# Patient Record
Sex: Male | Born: 1985
Health system: Southern US, Community
[De-identification: ages and names within clinical notes are randomized; demographics above are authoritative.]

## PROBLEM LIST (undated history)

## (undated) DIAGNOSIS — I10 Essential (primary) hypertension: Secondary | ICD-10-CM

## (undated) DIAGNOSIS — F32A Depression, unspecified: Secondary | ICD-10-CM

## (undated) DIAGNOSIS — F419 Anxiety disorder, unspecified: Secondary | ICD-10-CM

## (undated) DIAGNOSIS — K219 Gastro-esophageal reflux disease without esophagitis: Secondary | ICD-10-CM

## (undated) DIAGNOSIS — E785 Hyperlipidemia, unspecified: Secondary | ICD-10-CM

## (undated) DIAGNOSIS — R51 Headache: Secondary | ICD-10-CM

## (undated) DIAGNOSIS — R519 Headache, unspecified: Secondary | ICD-10-CM

## (undated) DIAGNOSIS — G8929 Other chronic pain: Secondary | ICD-10-CM

## (undated) DIAGNOSIS — T7840XA Allergy, unspecified, initial encounter: Secondary | ICD-10-CM

## (undated) HISTORY — DX: Headache, unspecified: R51.9

## (undated) HISTORY — DX: Essential (primary) hypertension: I10

## (undated) HISTORY — DX: Hyperlipidemia, unspecified: E78.5

## (undated) HISTORY — DX: Allergy, unspecified, initial encounter: T78.40XA

## (undated) HISTORY — DX: Depression, unspecified: F32.A

## (undated) HISTORY — DX: Anxiety disorder, unspecified: F41.9

## (undated) HISTORY — PX: APPENDECTOMY: SHX54

## (undated) HISTORY — DX: Other chronic pain: G89.29

## (undated) HISTORY — DX: Gastro-esophageal reflux disease without esophagitis: K21.9

## (undated) HISTORY — PX: NECK SURGERY: SHX720

## (undated) HISTORY — DX: Headache: R51

---

## 2015-03-13 ENCOUNTER — Emergency Department (HOSPITAL_BASED_OUTPATIENT_CLINIC_OR_DEPARTMENT_OTHER)
Admission: EM | Admit: 2015-03-13 | Discharge: 2015-03-13 | Disposition: A | Discharge status: Home / Self Care | Payer: BLUE CROSS/BLUE SHIELD | Attending: Emergency Medicine | Admitting: Emergency Medicine

## 2015-03-13 ENCOUNTER — Encounter (HOSPITAL_BASED_OUTPATIENT_CLINIC_OR_DEPARTMENT_OTHER): Payer: Self-pay | Admitting: *Deleted

## 2015-03-13 DIAGNOSIS — J40 Bronchitis, not specified as acute or chronic: Secondary | ICD-10-CM | POA: Diagnosis not present

## 2015-03-13 DIAGNOSIS — J040 Acute laryngitis: Secondary | ICD-10-CM

## 2015-03-13 DIAGNOSIS — R52 Pain, unspecified: Secondary | ICD-10-CM | POA: Insufficient documentation

## 2015-03-13 DIAGNOSIS — R05 Cough: Secondary | ICD-10-CM | POA: Diagnosis present

## 2015-03-13 MED ORDER — PREDNISONE 20 MG PO TABS: 40.0000 mg | ORAL_TABLET | Freq: Every day | ORAL | Status: DC

## 2015-03-13 MED ORDER — BENZONATATE 100 MG PO CAPS: 100.0000 mg | ORAL_CAPSULE | Freq: Three times a day (TID) | ORAL | Status: DC

## 2015-03-13 MED ORDER — IBUPROFEN 800 MG PO TABS: 800.0000 mg | ORAL_TABLET | Freq: Three times a day (TID) | ORAL | Status: DC

## 2015-03-13 NOTE — Discharge Instructions (Signed)
Laryngitis °Laryngitis is inflammation of your vocal cords. This causes hoarseness, coughing, loss of voice, sore throat, or a dry throat. Your vocal cords are two bands of muscles that are found in your throat. When you speak, these cords come together and vibrate. These vibrations come out through your mouth as sound. When your vocal cords are inflamed, your voice sounds different. °Laryngitis can be temporary (acute) or long-term (chronic). Most cases of acute laryngitis improve with time. Chronic laryngitis is laryngitis that lasts for more than three weeks. °CAUSES °Acute laryngitis may be caused by: °· A viral infection. °· Lots of talking, yelling, or singing. This is also called vocal strain. °· Bacterial infections. °Chronic laryngitis may be caused by: °· Vocal strain. °· Injury to your vocal cords. °· Acid reflux (gastroesophageal reflux disease or GERD). °· Allergies. °· Sinus infection. °· Smoking. °· Alcohol abuse. °· Breathing in chemicals or dust. °· Growths on the vocal cords. °RISK FACTORS °Risk factors for laryngitis include: °· Smoking. °· Alcohol abuse. °· Having allergies. °SIGNS AND SYMPTOMS °Symptoms of laryngitis may include: °· Low, hoarse voice. °· Loss of voice. °· Dry cough. °· Sore throat. °· Stuffy nose. °DIAGNOSIS °Laryngitis may be diagnosed by: °· Physical exam. °· Throat culture. °· Blood test. °· Laryngoscopy. This procedure allows your health care provider to look at your vocal cords with a mirror or viewing tube. °TREATMENT °Treatment for laryngitis depends on what is causing it. Usually, treatment involves resting your voice and using medicines to soothe your throat. However, if your laryngitis is caused by a bacterial infection, you may need to take antibiotic medicine. If your laryngitis is caused by a growth, you may need to have a procedure to remove it. °HOME CARE INSTRUCTIONS °· Drink enough fluid to keep your urine clear or pale yellow. °· Breathe in moist air. Use a  humidifier if you live in a dry climate. °· Take medicines only as directed by your health care provider. °· If you were prescribed an antibiotic medicine, finish it all even if you start to feel better. °· Do not smoke cigarettes or electronic cigarettes. If you need help quitting, ask your health care provider. °· Talk as little as possible. Also avoid whispering, which can cause vocal strain. °· Write instead of talking. Do this until your voice is back to normal. °SEEK MEDICAL CARE IF: °· You have a fever. °· You have increasing pain. °· You have difficulty swallowing. °SEEK IMMEDIATE MEDICAL CARE IF: °· You cough up blood. °· You have trouble breathing. °  °This information is not intended to replace advice given to you by your health care provider. Make sure you discuss any questions you have with your health care provider. °  °Document Released: 04/30/2005 Document Revised: 05/21/2014 Document Reviewed: 10/13/2013 °Elsevier Interactive Patient Education ©2016 [employer] °Acute Bronchitis °Bronchitis is inflammation of the airways that extend from the windpipe into the lungs (bronchi). The inflammation often causes mucus to develop. This leads to a cough, which is the most common symptom of bronchitis.  °In acute bronchitis, the condition usually develops suddenly and goes away over time, usually in a couple weeks. Smoking, allergies, and asthma can make bronchitis worse. Repeated episodes of bronchitis may cause further lung problems.  °CAUSES °Acute bronchitis is most often caused by the same virus that causes a cold. The virus can spread from person to person (contagious) through coughing, sneezing, and touching contaminated objects. °SIGNS AND SYMPTOMS  °· Cough.   °· Fever.   °·   Coughing up mucus.   °· Body aches.   °· Chest congestion.   °· Chills.   °· Shortness of breath.   °· Sore throat.   °DIAGNOSIS  °Acute bronchitis is usually diagnosed through a physical exam. Your health care provider will  also ask you questions about your medical history. Tests, such as chest X-rays, are sometimes done to rule out other conditions.  °TREATMENT  °Acute bronchitis usually goes away in a couple weeks. Oftentimes, no medical treatment is necessary. Medicines are sometimes given for relief of fever or cough. Antibiotic medicines are usually not needed but may be prescribed in certain situations. In some cases, an inhaler may be recommended to help reduce shortness of breath and control the cough. A cool mist vaporizer may also be used to help thin bronchial secretions and make it easier to clear the chest.  °HOME CARE INSTRUCTIONS °· Get plenty of rest.   °· Drink enough fluids to keep your urine clear or pale yellow (unless you have a medical condition that requires fluid restriction). Increasing fluids may help thin your respiratory secretions (sputum) and reduce chest congestion, and it will prevent dehydration.   °· Take medicines only as directed by your health care provider. °· If you were prescribed an antibiotic medicine, finish it all even if you start to feel better. °· Avoid smoking and secondhand smoke. Exposure to cigarette smoke or irritating chemicals will make bronchitis worse. If you are a smoker, consider using nicotine gum or skin patches to help control withdrawal symptoms. Quitting smoking will help your lungs heal faster.   °· Reduce the chances of another bout of acute bronchitis by washing your hands frequently, avoiding people with cold symptoms, and trying not to touch your hands to your mouth, nose, or eyes.   °· Keep all follow-up visits as directed by your health care provider.   °SEEK MEDICAL CARE IF: °Your symptoms do not improve after 1 week of treatment.  °SEEK IMMEDIATE MEDICAL CARE IF: °· You develop an increased fever or chills.   °· You have chest pain.   °· You have severe shortness of breath. °· You have bloody sputum.   °· You develop dehydration. °· You faint or repeatedly feel  like you are going to pass out. °· You develop repeated vomiting. °· You develop a severe headache. °MAKE SURE YOU:  °· Understand these instructions. °· Will watch your condition. °· Will get help right away if you are not doing well or get worse. °  °This information is not intended to replace advice given to you by your health care provider. Make sure you discuss any questions you have with your health care provider. °  °Document Released: 06/07/2004 Document Revised: 05/21/2014 Document Reviewed: 10/21/2012 °Elsevier Interactive Patient Education ©2016 [employer]. ° °

## 2015-03-13 NOTE — ED Notes (Signed)
Cough, laryngitis, fatigue, body aches x 2 days- no known fever

## 2015-03-13 NOTE — ED Provider Notes (Signed)
CSN: [id number]     Arrival date & time 03/13/15  1024 History   First MD Initiated Contact with Patient 03/13/15 1103     Chief Complaint  Patient presents with  . Cough     (Consider location/radiation/quality/duration/timing/severity/associated sxs/prior Treatment) HPI Comments: Patient presents to the ER for evaluation of cough, voice loss. Symptoms began 2 days ago. He reports that he first lost his voice, now has had a cough productive of clear and yellow sputum. He has had generalized aches but has not documented any fevers. There is no vomiting or diarrhea. Patient does not feel short of breath. He does not have a history of chronic lung disease.  Patient is a 29 y.o. male presenting with cough.  Cough   History reviewed. No pertinent past medical history. Past Surgical History  Procedure Laterality Date  . Appendectomy     No family history on file. Social History  Substance Use Topics  . Smoking status: Never Smoker   . Smokeless tobacco: Never Used  . Alcohol Use: No    Review of Systems  HENT: Positive for voice change.   Respiratory: Positive for cough.   All other systems reviewed and are negative.     Allergies  Review of patient's allergies indicates no known allergies.  Home Medications   Prior to Admission medications   Not on File   BP 124/57 mmHg  Pulse 72  Temp(Src) 98.5 F (36.9 C) (Oral)  Resp 17  Ht 5\' 10"  (1.778 m)  Wt 225 lb (102.059 kg)  BMI 32.28 kg/m2  SpO2 98% Physical Exam  Constitutional: He is oriented to person, place, and time. He appears well-developed and well-nourished. No distress.  HENT:  Head: Normocephalic and atraumatic.  Right Ear: Hearing normal.  Left Ear: Hearing normal.  Nose: Nose normal.  Mouth/Throat: Oropharynx is clear and moist and mucous membranes are normal.  Eyes: Conjunctivae and EOM are normal. Pupils are equal, round, and reactive to light.  Neck: Normal range of motion. Neck supple.   Cardiovascular: Regular rhythm, S1 normal and S2 normal.  Exam reveals no gallop and no friction rub.   No murmur heard. Pulmonary/Chest: Effort normal and breath sounds normal. No respiratory distress. He exhibits no tenderness.  Abdominal: Soft. Normal appearance and bowel sounds are normal. There is no hepatosplenomegaly. There is no tenderness. There is no rebound, no guarding, no tenderness at McBurney's point and negative Murphy's sign. No hernia.  Musculoskeletal: Normal range of motion.  Neurological: He is alert and oriented to person, place, and time. He has normal strength. No cranial nerve deficit or sensory deficit. Coordination normal. GCS eye subscore is 4. GCS verbal subscore is 5. GCS motor subscore is 6.  Skin: Skin is warm, dry and intact. No rash noted. No cyanosis.  Psychiatric: He has a normal mood and affect. His speech is normal and behavior is normal. Thought content normal.  Nursing note and vitals reviewed.   ED Course  Procedures (including critical care time) Labs Review Labs Reviewed - No data to display  Imaging Review No results found. I have personally reviewed and evaluated these images and lab results as part of my medical decision-making.   EKG Interpretation None      MDM   Final diagnoses:  None   laryngitis Bronchitis  She presents to the ER for evaluation of upper respiratory infection symptoms have been ongoing for 2 days. His lungs are clear. There is no acute bronchospasm and he does not  have any clinical concern for pneumonia. Oxygenation is 90% on room air. Vital signs are entirely normal. He is afebrile. Symptoms are consistent with a viral process causing laryngitis and bronchitis. Will treat symptomatically.    Gilda Crease, MD 03/13/15 1125

## 2015-08-05 ENCOUNTER — Telehealth: Payer: Self-pay | Admitting: Behavioral Health

## 2015-08-05 NOTE — Telephone Encounter (Signed)
Unable to reach patient at time of Pre-Visit Call.  Left message for patient to return call when available.    

## 2015-08-08 ENCOUNTER — Ambulatory Visit (INDEPENDENT_AMBULATORY_CARE_PROVIDER_SITE_OTHER): Payer: BLUE CROSS/BLUE SHIELD | Admitting: Medical

## 2015-08-08 ENCOUNTER — Encounter: Payer: Self-pay | Admitting: Medical

## 2015-08-08 VITALS — BP 118/78 | HR 90 | Temp 98.2°F | Resp 16 | Ht 70.0 in | Wt 234.0 lb

## 2015-08-08 DIAGNOSIS — M545 Low back pain: Secondary | ICD-10-CM | POA: Diagnosis not present

## 2015-08-08 DIAGNOSIS — J309 Allergic rhinitis, unspecified: Secondary | ICD-10-CM

## 2015-08-08 MED ORDER — LEVOCETIRIZINE DIHYDROCHLORIDE 5 MG PO TABS: 5.0000 mg | ORAL_TABLET | Freq: Every evening | ORAL | Status: DC

## 2015-08-08 MED ORDER — DICLOFENAC SODIUM 75 MG PO TBEC: 75.0000 mg | DELAYED_RELEASE_TABLET | Freq: Two times a day (BID) | ORAL | Status: DC

## 2015-08-08 MED ORDER — FLUTICASONE PROPIONATE 50 MCG/ACT NA SUSP: 2.0000 | Freq: Every day | NASAL | Status: DC

## 2015-08-08 MED FILL — FLUTICASONE PROP 50 MCG SPR: 50 | 30 days supply | Qty: 16 | Fill #0

## 2015-08-08 MED FILL — DICLOFENAC SOD EC 75 MG TAB: 75 | 15 days supply | Qty: 30 | Fill #0

## 2015-08-08 NOTE — Progress Notes (Signed)
Subjective:    Patient ID: Ian Romero, male    DOB: 1985-07-19, 30 y.o.   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 Married- 1 child.  Pt states nasal congestion year round allergies. He takes allegra D and mucinex. Pt used to use nasal spray in the past and it seemed to help.But then he stopped. Worse season is spring. Currently some sneezing and pnd. But he expects will flare soon.  Pt also had some low back pain last Monday. Got up from seated position. He felt spasms on past Monday and Tuesday. Hurt to walk. Pain is almost gone now. But faint occasional mild pain depending on positon and certain movements. No pain shooting to legs at any point. No leg weakness. No numbness.  Pt states very rare lower back pain. But usually never has.      Review of Systems  Constitutional: Negative for fever, chills and fatigue.  HENT: Positive for congestion, postnasal drip, rhinorrhea and sneezing.   Respiratory: Negative for chest tightness, shortness of breath and wheezing.   Cardiovascular: Negative for chest pain and palpitations.  Gastrointestinal: Negative for abdominal pain.  Musculoskeletal: Positive for back pain. Negative for gait problem.  Neurological: Negative for dizziness, numbness and headaches.  Hematological: Negative for adenopathy. Does not bruise/bleed easily.  Psychiatric/Behavioral: Negative for confusion and agitation.    Past Medical History  Diagnosis Date  . Allergy     Social History   Social History  . Marital Status: Married    Spouse Name: N/A  . Number of Children: N/A  . Years of Education: N/A   Occupational History  . Not on file.   Social History Main Topics  . Smoking status: Never Smoker   . Smokeless tobacco: Never Used  . Alcohol Use: No  . Drug Use: No  . Sexual  Activity: Yes   Other Topics Concern  . Not on file   Social History Narrative    Past Surgical History  Procedure Laterality Date  . Appendectomy      History reviewed. No pertinent family history.  No Known Allergies  No current outpatient prescriptions on file prior to visit.   No current facility-administered medications on file prior to visit.    BP 118/78 mmHg  Pulse 90  Temp(Src) 98.2 F (36.8 C) (Oral)  Resp 16  Ht  (1.778 m)  Wt 234 lb (106.142 kg)  BMI 33.58 kg/m2  SpO2 98%       Objective:   Physical Exam   General  Mental Status - Alert. General Appearance - Well groomed. Not in acute distress.  Skin Rashes- No Rashes.  HEENT Head- Normal. Ear Auditory Canal - Left- Normal. Right - Normal.Tympanic Membrane- Left- Normal. Right- Normal. Eye Sclera/Conjunctiva- Left- Normal. Right- Normal. Nose & Sinuses Nasal Mucosa- Left-  Boggy and Congested. Right-  Boggy and  Congested.Bilateral maxillary and frontal sinus pressure. Mouth & Throat Lips: Upper Lip- Normal: no dryness, cracking, pallor, cyanosis, or vesicular eruption. Lower Lip-Normal: no dryness, cracking, pallor, cyanosis or vesicular eruption. Buccal Mucosa- Bilateral- No Aphthous ulcers. Oropharynx- No Discharge or Erythema. Tonsils: Characteristics- Bilateral- No Erythema or Congestion. Size/Enlargement- Bilateral- No enlargement. Discharge- bilateral-None.  Neck Neck- Supple. No Masses.   Chest and Lung Exam Auscultation: Breath Sounds:-Clear even and unlabored.  Cardiovascular Auscultation:Rythm- Regular, rate and rhythm. Murmurs & Other Heart Sounds:Ausculatation of the heart reveal- No Murmurs.  Lymphatic Head & Neck General Head & Neck Lymphatics: Bilateral: Description- No Localized lymphadenopathy.   Spleen:- Normal.   Back Mid faint to left of  lumbar spine(si area) tenderness to palpation. Pain none  on straight leg lift. Pain none on lateral movements  and flexion/extension of the spine.  Lower ext neurologic  L5-S1 sensation intact bilaterally. Normal patellar reflexes bilaterally. No foot drop bilaterally.      Assessment & Plan:  For allergies will rx xyzal. If this does not work well then go back to [employer].  Rx flonase as well for nasal congestion.  For your back pain rx diclofenac. Conservative measures discussed. If back pain is persisting past another week then get xray lumbar spine.  Follow up in 2 weeks or as needed.  You can also schedule for CPE .

## 2015-08-08 NOTE — Progress Notes (Signed)
Pre visit review using our clinic review tool, if applicable. No additional management support is needed unless otherwise documented below in the visit note. 

## 2015-08-08 NOTE — Patient Instructions (Signed)
For allergies will rx xyzal. If this does not work well then go back to [employer].  Rx flonase as well for nasal congestion.  For your back pain rx diclofenac. Conservative measures discussed. If back pain is persisting past another week then get xray lumbar spine.  Follow up in 2 weeks or as needed.  You can also schedule for CPE .

## 2015-08-23 ENCOUNTER — Telehealth: Payer: Self-pay

## 2015-08-23 NOTE — Telephone Encounter (Signed)
Attempted to update chart with wife. Wife not sure about  All information. Left message for patient to return call.

## 2015-08-23 NOTE — Telephone Encounter (Signed)
Patient returning your call.

## 2015-08-24 ENCOUNTER — Encounter: Payer: Self-pay | Admitting: Medical

## 2015-08-24 ENCOUNTER — Ambulatory Visit (INDEPENDENT_AMBULATORY_CARE_PROVIDER_SITE_OTHER): Payer: BLUE CROSS/BLUE SHIELD | Admitting: Medical

## 2015-08-24 VITALS — BP 120/76 | HR 89 | Temp 98.1°F | Ht 70.0 in | Wt 234.2 lb

## 2015-08-24 DIAGNOSIS — Z0000 Encounter for general adult medical examination without abnormal findings: Secondary | ICD-10-CM | POA: Diagnosis not present

## 2015-08-24 DIAGNOSIS — Z113 Encounter for screening for infections with a predominantly sexual mode of transmission: Secondary | ICD-10-CM

## 2015-08-24 DIAGNOSIS — Z23 Encounter for immunization: Secondary | ICD-10-CM | POA: Diagnosis not present

## 2015-08-24 LAB — LIPID PANEL
CHOL/HDL RATIO: 6
Cholesterol: 170 mg/dL (ref 0–200)
HDL: 28.7 mg/dL — AB (ref >39.00)
LDL Cholesterol: 103 mg/dL — ABNORMAL HIGH (ref 0–99)
NONHDL: 140.87
Triglycerides: 189 mg/dL — ABNORMAL HIGH (ref 0.0–149.0)
VLDL: 37.8 mg/dL (ref 0.0–40.0)

## 2015-08-24 LAB — POC URINALSYSI DIPSTICK (AUTOMATED)
Bilirubin, UA: NEGATIVE
GLUCOSE UA: NEGATIVE
Leukocytes, UA: NEGATIVE
NITRITE UA: NEGATIVE
RBC UA: NEGATIVE
Urobilinogen, UA: 0.2
pH, UA: 5.5

## 2015-08-24 LAB — CBC WITH DIFFERENTIAL/PLATELET
BASOS PCT: 0.6 % (ref 0.0–3.0)
Basophils Absolute: 0 10*3/uL (ref 0.0–0.1)
EOS PCT: 3.7 % (ref 0.0–5.0)
Eosinophils Absolute: 0.1 10*3/uL (ref 0.0–0.7)
HCT: 44.5 % (ref 39.0–52.0)
Hemoglobin: 14.8 g/dL (ref 13.0–17.0)
LYMPHS ABS: 2 10*3/uL (ref 0.7–4.0)
Lymphocytes Relative: 51.8 % — ABNORMAL HIGH (ref 12.0–46.0)
MCHC: 33.2 g/dL (ref 30.0–36.0)
MCV: 84.4 fl (ref 78.0–100.0)
MONO ABS: 0.3 10*3/uL (ref 0.1–1.0)
Monocytes Relative: 6.6 % (ref 3.0–12.0)
NEUTROS PCT: 37.3 % — AB (ref 43.0–77.0)
Neutro Abs: 1.5 10*3/uL (ref 1.4–7.7)
PLATELETS: 229 10*3/uL (ref 150.0–400.0)
RBC: 5.28 Mil/uL (ref 4.22–5.81)
RDW: 14.6 % (ref 11.5–15.5)
WBC: 3.9 10*3/uL — ABNORMAL LOW (ref 4.0–10.5)

## 2015-08-24 LAB — COMPREHENSIVE METABOLIC PANEL
ALT: 53 U/L (ref 0–53)
AST: 27 U/L (ref 0–37)
Albumin: 4.7 g/dL (ref 3.5–5.2)
Alkaline Phosphatase: 66 U/L (ref 39–117)
BUN: 15 mg/dL (ref 6–23)
CHLORIDE: 104 meq/L (ref 96–112)
CO2: 32 mEq/L (ref 19–32)
Calcium: 9.9 mg/dL (ref 8.4–10.5)
Creatinine, Ser: 1 mg/dL (ref 0.40–1.50)
GFR: 112 mL/min (ref >60.00)
GLUCOSE: 90 mg/dL (ref 70–99)
POTASSIUM: 4.3 meq/L (ref 3.5–5.1)
SODIUM: 140 meq/L (ref 135–145)
Total Bilirubin: 0.5 mg/dL (ref 0.2–1.2)
Total Protein: 7.5 g/dL (ref 6.0–8.3)

## 2015-08-24 LAB — HIV ANTIBODY (ROUTINE TESTING W REFLEX): HIV 1&2 Ab, 4th Generation: NONREACTIVE

## 2015-08-24 LAB — TSH: TSH: 1.1 u[IU]/mL (ref 0.35–4.50)

## 2015-08-24 MED ORDER — SUMATRIPTAN SUCCINATE 50 MG PO TABS: 50.0000 mg | ORAL_TABLET | ORAL | Status: DC | PRN

## 2015-08-24 NOTE — Assessment & Plan Note (Signed)
Cbc, cmp, tsh, lipid, hiv screen, ua and tdap today.

## 2015-08-24 NOTE — Progress Notes (Signed)
Pre visit review using our clinic review tool, if applicable. No additional management support is needed unless otherwise documented below in the visit note. 

## 2015-08-24 NOTE — Addendum Note (Signed)
Addended by: Neldon LabellaMABE, HOLDEN S on: 08/24/2015 02:14 PM   Modules accepted: Orders

## 2015-08-24 NOTE — Telephone Encounter (Signed)
Appointment scheduled for today 

## 2015-08-24 NOTE — Progress Notes (Signed)
Subjective:    Patient ID: Ian Romero, male    DOB: 02/25/1986, 30 y.o.   MRN: [id number]  HPI  Pt in for a physical.  Pt fasting.  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 Pt does exercise 3 times a week, Drinks soda 4 times a week, Avoids eating out. Married- 1 child.  Pt needs tetanus vaccine. Will get hiv screen today.    Review of Systems  Constitutional: Negative for fever, chills, diaphoresis, activity change and fatigue.  Respiratory: Negative for cough, chest tightness and shortness of breath.   Cardiovascular: Negative for chest pain, palpitations and leg swelling.  Gastrointestinal: Negative for nausea, vomiting and abdominal pain.  Musculoskeletal: Negative for neck pain and neck stiffness.  Neurological: Positive for headaches. Negative for dizziness, weakness and numbness.       Minimal faint ha present  weekend. . He states faint light senstiivity. But no gross motor or sensory deficits.  Mildd sound sensitiive.  Saturday and Sunday had HA. Then went away. Hx of migraine. In [place] was given medication in the past.  Psychiatric/Behavioral: Negative for behavioral problems, confusion and agitation. The patient is not nervous/anxious.    Past Medical History  Diagnosis Date  . Allergy     Social History   Social History  . Marital Status: Married    Spouse Name: N/A  . Number of Children: N/A  . Years of Education: N/A   Occupational History  . Not on file.   Social History Main Topics  . Smoking status: Never Smoker   . Smokeless tobacco: Never Used  . Alcohol Use: No  . Drug Use: No  . Sexual Activity: Yes   Other Topics Concern  . Not on file   Social History Narrative    Past Surgical History  Procedure Laterality Date  . Appendectomy      No family history on file.  No Known Allergies  Current Outpatient Prescriptions on File Prior to Visit  Medication  Sig Dispense Refill  . diclofenac (VOLTAREN) 75 MG EC tablet Take 1 tablet (75 mg total) by mouth 2 (two) times daily. 30 tablet 0  . fluticasone (FLONASE) 50 MCG/ACT nasal spray Place 2 sprays into both nostrils daily. 16 g 11  . levocetirizine (XYZAL) 5 MG tablet Take 1 tablet (5 mg total) by mouth every evening. 90 tablet 3   No current facility-administered medications on file prior to visit.    BP 120/76 mmHg  Pulse 89  Temp(Src) 98.1 F (36.7 C) (Oral)  Ht 5\' 10"  (1.778 m)  Wt 234 lb 3.2 oz (106.232 kg)  BMI 33.60 kg/m2  SpO2 98%      Objective:   Physical Exam  General Mental Status- Alert. General Appearance- Not in acute distress.   Skin General: Color- Normal Color. Moisture- Normal Moisture.  Neck Carotid Arteries- Normal color. Moisture- Normal Moisture. No carotid bruits. No JVD.  Chest and Lung Exam Auscultation: Breath Sounds:-Normal.  Cardiovascular Auscultation:Rythm- Regular. Murmurs & Other Heart Sounds:Auscultation of the heart reveals- No Murmurs.  Abdomen Inspection:-Inspeection Normal. Palpation/Percussion:Note:No mass. Palpation and Percussion of the abdomen reveal- Non Tender, Non Distended + BS, no rebound or guarding.  Neurologic Cranial Nerve exam:- CN III-XII intact(No nystagmus), symmetric smile. Strength:- 5/5 equal and symmetric strength both upper and lower extremities.   Male Genitourinary Pt deferred.          Assessment & Plan:

## 2015-08-24 NOTE — Patient Instructions (Addendum)
Wellness examination Cbc, cmp, tsh, lipid, hiv screen, ua and tdap today.   Continue healthy diet and exercise. BMI 33 presently. Best if could get below 30. Weight loss 10-15 pounds would likely put below 30.  Your describe in past maybe migraine type ha. Will rx imitrex to use. If you do use I want you to call us and give [place] report if stopped ha. If does not stop ha/severe ha then ED evaluation. To be determined after lab review. Preventive Care for Adults, Male A healthy lifestyle and preventive care can promote health and wellness. Preventive health guidelines for men include the following key practices:  A routine yearly physical is a good way to check with your health care provider about your health and preventative screening. It is a chance to share any concerns and updates on your health and to receive a thorough exam.  Visit your dentist for a routine exam and preventative care every 6 months. Brush your teeth twice a day and floss once a day. Good oral hygiene prevents tooth decay and gum disease.  The frequency of eye exams is based on your age, health, family medical history, use of contact lenses, and other factors. Follow your health care provider's recommendations for frequency of eye exams.  Eat a healthy diet. Foods such as vegetables, fruits, whole grains, low-fat dairy products, and lean protein foods contain the nutrients you need without too many calories. Decrease your intake of foods high in solid fats, added sugars, and salt. Eat the right amount of calories for you.Get information about a proper diet from your health care provider, if necessary.  Regular physical exercise is one of the most important things you can do for your health. Most adults should get at least 150 minutes of moderate-intensity exercise (any activity that increases your heart rate and causes you to sweat) each week. In addition, most adults need muscle-strengthening exercises on 2 or more days a  week.  Maintain a healthy weight. The body mass index (BMI) is a screening tool to identify possible weight problems. It provides an estimate of body fat based on height and weight. Your health care provider can find your BMI and can help you achieve or maintain a healthy weight.For adults 20 years and older:  A BMI below 18.5 is considered underweight.  A BMI of 18.5 to 24.9 is normal.  A BMI of 25 to 29.9 is considered overweight.  A BMI of 30 and above is considered obese.  Maintain normal blood lipids and cholesterol levels by exercising and minimizing your intake of saturated fat. Eat a balanced diet with plenty of fruit and vegetables. Blood tests for lipids and cholesterol should begin at age 45 and be repeated every 5 years. If your lipid or cholesterol levels are high, you are over 50, or you are at high risk for heart disease, you may need your cholesterol levels checked more frequently.Ongoing high lipid and cholesterol levels should be treated with medicines if diet and exercise are not working.  If you smoke, find out from your health care provider how to quit. If you do not use tobacco, do not start.  Lung cancer screening is recommended for adults aged 24-80 years who are at high risk for developing lung cancer because of a history of smoking. A yearly low-dose CT scan of the lungs is recommended for people who have at least a 30-pack-year history of smoking and are a current smoker or have quit within the past 15 years.  A pack year of smoking is smoking an average of 1 pack of cigarettes a day for 1 year (for example: 1 pack a day for 30 years or 2 packs a day for 15 years). Yearly screening should continue until the smoker has stopped smoking for at least 15 years. Yearly screening should be stopped for people who develop a health problem that would prevent them from having lung cancer treatment.  If you choose to drink alcohol, do not have more than 2 drinks per day. One drink  is considered to be 12 ounces (355 mL) of beer, 5 ounces (148 mL) of wine, or 1.5 ounces (44 mL) of liquor.  Avoid use of street drugs. Do not share needles with anyone. Ask for help if you need support or instructions about stopping the use of drugs.  High blood pressure causes heart disease and increases the risk of stroke. Your blood pressure should be checked at least every 1-2 years. Ongoing high blood pressure should be treated with medicines, if weight loss and exercise are not effective.  If you are 81-47 years old, ask your health care provider if you should take aspirin to prevent heart disease.  Diabetes screening is done by taking a blood sample to check your blood glucose level after you have not eaten for a certain period of time (fasting). If you are not overweight and you do not have risk factors for diabetes, you should be screened once every 3 years starting at age 56. If you are overweight or obese and you are 72-74 years of age, you should be screened for diabetes every year as part of your cardiovascular risk assessment.  Colorectal cancer can be detected and often prevented. Most routine colorectal cancer screening begins at the age of 77 and continues through age 41. However, your health care provider may recommend screening at an earlier age if you have risk factors for colon cancer. On a yearly basis, your health care provider may provide home test kits to check for hidden blood in the stool. Use of a small camera at the end of a tube to directly examine the colon (sigmoidoscopy or colonoscopy) can detect the earliest forms of colorectal cancer. Talk to your health care provider about this at age 55, when routine screening begins. Direct exam of the colon should be repeated every 5-10 years through age 17, unless early forms of precancerous polyps or small growths are found.  People who are at an increased risk for hepatitis B should be screened for this virus. You are considered  at high risk for hepatitis B if:  You were born in a country where hepatitis B occurs often. Talk with your health care provider about which countries are considered high risk.  Your parents were born in a high-risk country and you have not received a shot to protect against hepatitis B (hepatitis B vaccine).  You have HIV or AIDS.  You use needles to inject street drugs.  You live with, or have sex with, someone who has hepatitis B.  You are a man who has sex with other men (MSM).  You get hemodialysis treatment.  You take certain medicines for conditions such as cancer, organ transplantation, and autoimmune conditions.  Hepatitis C blood testing is recommended for all people born from [birth year] through 1965 and any individual with known risks for hepatitis C.  Practice safe sex. Use condoms and avoid high-risk sexual practices to reduce the spread of sexually transmitted infections (STIs). STIs include  gonorrhea, chlamydia, syphilis, trichomonas, herpes, HPV, and human immunodeficiency virus (HIV). Herpes, HIV, and HPV are viral illnesses that have no cure. They can result in disability, cancer, and death.  If you are a man who has sex with other men, you should be screened at least once per year for:  HIV.  Urethral, rectal, and pharyngeal infection of gonorrhea, chlamydia, or both.  If you are at risk of being infected with HIV, it is recommended that you take a prescription medicine daily to prevent HIV infection. This is called preexposure prophylaxis (PrEP). You are considered at risk if:  You are a man who has sex with other men (MSM) and have other risk factors.  You are a heterosexual man, are sexually active, and are at increased risk for HIV infection.  You take drugs by injection.  You are sexually active with a partner who has HIV.  Talk with your health care provider about whether you are at high risk of being infected with HIV. If you choose to begin PrEP, you should  first be tested for HIV. You should then be tested every 3 months for as long as you are taking PrEP.  A one-time screening for abdominal aortic aneurysm (AAA) and surgical repair of large AAAs by ultrasound are recommended for men ages 82 to 50 years who are current or former smokers.  Healthy men should no longer receive prostate-specific antigen (PSA) blood tests as part of routine cancer screening. Talk with your health care provider about prostate cancer screening.  Testicular cancer screening is not recommended for adult males who have no symptoms. Screening includes self-exam, a health care provider exam, and other screening tests. Consult with your health care provider about any symptoms you have or any concerns you have about testicular cancer.  Use sunscreen. Apply sunscreen liberally and repeatedly throughout the day. You should seek shade when your shadow is shorter than you. Protect yourself by wearing long sleeves, pants, a wide-brimmed hat, and sunglasses year round, whenever you are outdoors.  Once a month, do a whole-body skin exam, using a mirror to look at the skin on your back. Tell your health care provider about new moles, moles that have irregular borders, moles that are larger than a pencil eraser, or moles that have changed in shape or color.  Stay current with required vaccines (immunizations).  Influenza vaccine. All adults should be immunized every year.  Tetanus, diphtheria, and acellular pertussis (Td, Tdap) vaccine. An adult who has not previously received Tdap or who does not know his vaccine status should receive 1 dose of Tdap. This initial dose should be followed by tetanus and diphtheria toxoids (Td) booster doses every 10 years. Adults with an unknown or incomplete history of completing a 3-dose immunization series with Td-containing vaccines should begin or complete a primary immunization series including a Tdap dose. Adults should receive a Td booster every 10  years.  Varicella vaccine. An adult without evidence of immunity to varicella should receive 2 doses or a second dose if he has previously received 1 dose.  Human papillomavirus (HPV) vaccine. Males aged 11-21 years who have not received the vaccine previously should receive the 3-dose series. Males aged 22-26 years may be immunized. Immunization is recommended through the age of 27 years for any male who has sex with males and did not get any or all doses earlier. Immunization is recommended for any person with an immunocompromised condition through the age of 34 years if he did not  get any or all doses earlier. During the 3-dose series, the second dose should be obtained 4-8 weeks after the first dose. The third dose should be obtained 24 weeks after the first dose and 16 weeks after the second dose.  Zoster vaccine. One dose is recommended for adults aged 53 years or older unless certain conditions are present.  Measles, mumps, and rubella (MMR) vaccine. Adults born before [birth year] generally are considered immune to measles and mumps. Adults born in [birth year] or later should have 1 or more doses of MMR vaccine unless there is a contraindication to the vaccine or there is laboratory evidence of immunity to each of the three diseases. A routine second dose of MMR vaccine should be obtained at least 28 days after the first dose for students attending postsecondary schools, health care workers, or international travelers. People who received inactivated measles vaccine or an unknown type of measles vaccine during 1963-1967 should receive 2 doses of MMR vaccine. People who received inactivated mumps vaccine or an unknown type of mumps vaccine before 1979 and are at high risk for mumps infection should consider immunization with 2 doses of MMR vaccine. Unvaccinated health care workers born before [birth year] who lack laboratory evidence of measles, mumps, or rubella immunity or laboratory confirmation of disease should  consider measles and mumps immunization with 2 doses of MMR vaccine or rubella immunization with 1 dose of MMR vaccine.  Pneumococcal 13-valent conjugate (PCV13) vaccine. When indicated, a person who is uncertain of his immunization history and has no record of immunization should receive the PCV13 vaccine. All adults 25 years of age and older should receive this vaccine. An adult aged 7 years or older who has certain medical conditions and has not been previously immunized should receive 1 dose of PCV13 vaccine. This PCV13 should be followed with a dose of pneumococcal polysaccharide (PPSV23) vaccine. Adults who are at high risk for pneumococcal disease should obtain the PPSV23 vaccine at least 8 weeks after the dose of PCV13 vaccine. Adults older than 31 years of age who have normal immune system function should obtain the PPSV23 vaccine dose at least 1 year after the dose of PCV13 vaccine.  Pneumococcal polysaccharide (PPSV23) vaccine. When PCV13 is also indicated, PCV13 should be obtained first. All adults aged 26 years and older should be immunized. An adult younger than age 70 years who has certain medical conditions should be immunized. Any person who resides in a nursing home or long-term care facility should be immunized. An adult smoker should be immunized. People with an immunocompromised condition and certain other conditions should receive both PCV13 and PPSV23 vaccines. People with human immunodeficiency virus (HIV) infection should be immunized as soon as possible after diagnosis. Immunization during chemotherapy or radiation therapy should be avoided. Routine use of PPSV23 vaccine is not recommended for American Indians, [place] Natives, or people younger than 65 years unless there are medical conditions that require PPSV23 vaccine. When indicated, people who have unknown immunization and have no record of immunization should receive PPSV23 vaccine. One-time revaccination 5 years after the first  dose of PPSV23 is recommended for people aged 19-64 years who have chronic kidney failure, nephrotic syndrome, asplenia, or immunocompromised conditions. People who received 1-2 doses of PPSV23 before age 16 years should receive another dose of PPSV23 vaccine at age 105 years or later if at least 5 years have passed since the previous dose. Doses of PPSV23 are not needed for people immunized with PPSV23 at or after age 12  years.  Meningococcal vaccine. Adults with asplenia or persistent complement component deficiencies should receive 2 doses of quadrivalent meningococcal conjugate (MenACWY-D) vaccine. The doses should be obtained at least 2 months apart. Microbiologists working with certain meningococcal bacteria, [place] recruits, people at risk during an outbreak, and people who travel to or live in countries with a high rate of meningitis should be immunized. A first-year college student up through age 54 years who is living in a residence hall should receive a dose if he did not receive a dose on or after his 16th birthday. Adults who have certain high-risk conditions should receive one or more doses of vaccine.  Hepatitis A vaccine. Adults who wish to be protected from this disease, have chronic liver disease, work with hepatitis A-infected animals, work in hepatitis A research labs, or travel to or work in countries with a high rate of hepatitis A should be immunized. Adults who were previously unvaccinated and who anticipate close contact with an international adoptee during the first 60 days after arrival in the [place] from a country with a high rate of hepatitis A should be immunized.  Hepatitis B vaccine. Adults should be immunized if they wish to be protected from this disease, are under age 41 years and have diabetes, have chronic liver disease, have had more than one sex partner in the past 6 months, may be exposed to blood or other infectious body fluids, are household contacts or sex  partners of hepatitis B positive people, are clients or workers in certain care facilities, or travel to or work in countries with a high rate of hepatitis B.  Haemophilus influenzae type b (Hib) vaccine. A previously unvaccinated person with asplenia or sickle cell disease or having a scheduled splenectomy should receive 1 dose of Hib vaccine. Regardless of previous immunization, a recipient of a hematopoietic stem cell transplant should receive a 3-dose series 6-12 months after his successful transplant. Hib vaccine is not recommended for adults with HIV infection. Preventive Service / Frequency Ages 26 to 26  Blood pressure check.** / Every 3-5 years.  Lipid and cholesterol check.** / Every 5 years beginning at age 66.  Hepatitis C blood test.** / For any individual with known risks for hepatitis C.  Skin self-exam. / Monthly.  Influenza vaccine. / Every year.  Tetanus, diphtheria, and acellular pertussis (Tdap, Td) vaccine.** / Consult your health care provider. 1 dose of Td every 10 years.  Varicella vaccine.** / Consult your health care provider.  HPV vaccine. / 3 doses over 6 months, if 55 or younger.  Measles, mumps, rubella (MMR) vaccine.** / You need at least 1 dose of MMR if you were born in 1957 or later. You may also need a second dose.  Pneumococcal 13-valent conjugate (PCV13) vaccine.** / Consult your health care provider.  Pneumococcal polysaccharide (PPSV23) vaccine.** / 1 to 2 doses if you smoke cigarettes or if you have certain conditions.  Meningococcal vaccine.** / 1 dose if you are age 44 to 31 years and a [employer] researcher living in a residence hall, or have one of several medical conditions. You may also need additional booster doses.  Hepatitis A vaccine.** / Consult your health care provider.  Hepatitis B vaccine.** / Consult your health care provider.  Haemophilus influenzae type b (Hib) vaccine.** / Consult your health care provider. Ages 67 to  76  Blood pressure check.** / Every year.  Lipid and cholesterol check.** / Every 5 years beginning at age 65.  Lung  cancer screening. / Every year if you are aged 60-80 years and have a 30-pack-year history of smoking and currently smoke or have quit within the past 15 years. Yearly screening is stopped once you have quit smoking for at least 15 years or develop a health problem that would prevent you from having lung cancer treatment.  Fecal occult blood test (FOBT) of stool. / Every year beginning at age 22 and continuing until age 21. You may not have to do this test if you get a colonoscopy every 10 years.  Flexible sigmoidoscopy** or colonoscopy.** / Every 5 years for a flexible sigmoidoscopy or every 10 years for a colonoscopy beginning at age 3 and continuing until age 38.  Hepatitis C blood test.** / For all people born from [birth year] through 1965 and any individual with known risks for hepatitis C.  Skin self-exam. / Monthly.  Influenza vaccine. / Every year.  Tetanus, diphtheria, and acellular pertussis (Tdap/Td) vaccine.** / Consult your health care provider. 1 dose of Td every 10 years.  Varicella vaccine.** / Consult your health care provider.  Zoster vaccine.** / 1 dose for adults aged 57 years or older.  Measles, mumps, rubella (MMR) vaccine.** / You need at least 1 dose of MMR if you were born in 1957 or later. You may also need a second dose.  Pneumococcal 13-valent conjugate (PCV13) vaccine.** / Consult your health care provider.  Pneumococcal polysaccharide (PPSV23) vaccine.** / 1 to 2 doses if you smoke cigarettes or if you have certain conditions.  Meningococcal vaccine.** / Consult your health care provider.  Hepatitis A vaccine.** / Consult your health care provider.  Hepatitis B vaccine.** / Consult your health care provider.  Haemophilus influenzae type b (Hib) vaccine.** / Consult your health care provider. Ages 25 and over  Blood pressure check.** /  Every year.  Lipid and cholesterol check.**/ Every 5 years beginning at age 55.  Lung cancer screening. / Every year if you are aged 6-80 years and have a 30-pack-year history of smoking and currently smoke or have quit within the past 15 years. Yearly screening is stopped once you have quit smoking for at least 15 years or develop a health problem that would prevent you from having lung cancer treatment.  Fecal occult blood test (FOBT) of stool. / Every year beginning at age 70 and continuing until age 83. You may not have to do this test if you get a colonoscopy every 10 years.  Flexible sigmoidoscopy** or colonoscopy.** / Every 5 years for a flexible sigmoidoscopy or every 10 years for a colonoscopy beginning at age 40 and continuing until age 33.  Hepatitis C blood test.** / For all people born from [birth year] through 1966 and any individual with known risks for hepatitis C.  Abdominal aortic aneurysm (AAA) screening.** / A one-time screening for ages 20 to 34 years who are current or former smokers.  Skin self-exam. / Monthly.  Influenza vaccine. / Every year.  Tetanus, diphtheria, and acellular pertussis (Tdap/Td) vaccine.** / 1 dose of Td every 10 years.  Varicella vaccine.** / Consult your health care provider.  Zoster vaccine.** / 1 dose for adults aged 37 years or older.  Pneumococcal 13-valent conjugate (PCV13) vaccine.** / 1 dose for all adults aged 54 years and older.  Pneumococcal polysaccharide (PPSV23) vaccine.** / 1 dose for all adults aged 66 years and older.  Meningococcal vaccine.** / Consult your health care provider.  Hepatitis A vaccine.** / Consult your health care provider.  Hepatitis B  vaccine.** / Consult your health care provider.  Haemophilus influenzae type b (Hib) vaccine.** / Consult your health care provider. **Family history and personal history of risk and conditions may change your health care provider's recommendations.   This information is not  intended to replace advice given to you by your health care provider. Make sure you discuss any questions you have with your health care provider.   Document Released: 06/26/2001 Document Revised: 05/21/2014 Document Reviewed: 09/25/2010 Elsevier Interactive Patient Education [employer].

## 2015-08-27 ENCOUNTER — Encounter (HOSPITAL_BASED_OUTPATIENT_CLINIC_OR_DEPARTMENT_OTHER): Payer: Self-pay | Admitting: *Deleted

## 2015-08-27 ENCOUNTER — Emergency Department (HOSPITAL_BASED_OUTPATIENT_CLINIC_OR_DEPARTMENT_OTHER)
Admission: EM | Admit: 2015-08-27 | Discharge: 2015-08-27 | Disposition: A | Discharge status: Home / Self Care | Payer: BLUE CROSS/BLUE SHIELD | Attending: Emergency Medicine | Admitting: Emergency Medicine

## 2015-08-27 ENCOUNTER — Emergency Department (HOSPITAL_BASED_OUTPATIENT_CLINIC_OR_DEPARTMENT_OTHER): Payer: BLUE CROSS/BLUE SHIELD

## 2015-08-27 DIAGNOSIS — R509 Fever, unspecified: Secondary | ICD-10-CM | POA: Diagnosis present

## 2015-08-27 DIAGNOSIS — J301 Allergic rhinitis due to pollen: Secondary | ICD-10-CM | POA: Diagnosis not present

## 2015-08-27 DIAGNOSIS — M791 Myalgia: Secondary | ICD-10-CM | POA: Insufficient documentation

## 2015-08-27 DIAGNOSIS — R0981 Nasal congestion: Secondary | ICD-10-CM

## 2015-08-27 DIAGNOSIS — Z7951 Long term (current) use of inhaled steroids: Secondary | ICD-10-CM | POA: Diagnosis not present

## 2015-08-27 DIAGNOSIS — H6123 Impacted cerumen, bilateral: Secondary | ICD-10-CM | POA: Insufficient documentation

## 2015-08-27 DIAGNOSIS — Z791 Long term (current) use of non-steroidal anti-inflammatories (NSAID): Secondary | ICD-10-CM | POA: Diagnosis not present

## 2015-08-27 DIAGNOSIS — Z79899 Other long term (current) drug therapy: Secondary | ICD-10-CM | POA: Insufficient documentation

## 2015-08-27 MED ORDER — IBUPROFEN 600 MG PO TABS: 600.0000 mg | ORAL_TABLET | Freq: Four times a day (QID) | ORAL | Status: DC | PRN

## 2015-08-27 MED ORDER — OXYMETAZOLINE HCL 0.05 % NA SOLN: 1.0000 | Freq: Two times a day (BID) | NASAL | Status: DC

## 2015-08-27 MED ORDER — IBUPROFEN 400 MG PO TABS
600.0000 mg | ORAL_TABLET | Freq: Once | ORAL | Status: AC
  Administered 2015-08-27: 600 mg via ORAL
  Filled 2015-08-27: qty 1

## 2015-08-27 MED ORDER — BENZONATATE 100 MG PO CAPS: 100.0000 mg | ORAL_CAPSULE | Freq: Three times a day (TID) | ORAL | Status: DC

## 2015-08-27 NOTE — Discharge Instructions (Signed)
Allergic Rhinitis Allergic rhinitis is when the mucous membranes in the nose respond to allergens. Allergens are particles in the air that cause your body to have an allergic reaction. This causes you to release allergic antibodies. Through a chain of events, these eventually cause you to release histamine into the blood stream. Although meant to protect the body, it is this release of histamine that causes your discomfort, such as frequent sneezing, congestion, and an itchy, runny nose.  CAUSES Seasonal allergic rhinitis (hay fever) is caused by pollen allergens that may come from grasses, trees, and weeds. Year-round allergic rhinitis (perennial allergic rhinitis) is caused by allergens such as house dust mites, pet dander, and mold spores. SYMPTOMS  Nasal stuffiness (congestion).  Itchy, runny nose with sneezing and tearing of the eyes. DIAGNOSIS Your health care provider can help you determine the allergen or allergens that trigger your symptoms. If you and your health care provider are unable to determine the allergen, skin or blood testing may be used. Your health care provider will diagnose your condition after taking your health history and performing a physical exam. Your health care provider may assess you for other related conditions, such as asthma, pink eye, or an ear infection. TREATMENT Allergic rhinitis does not have a cure, but it can be controlled by:  Medicines that block allergy symptoms. These may include allergy shots, nasal sprays, and oral antihistamines.  Avoiding the allergen. Hay fever may often be treated with antihistamines in pill or nasal spray forms. Antihistamines block the effects of histamine. There are over-the-counter medicines that may help with nasal congestion and swelling around the eyes. Check with your health care provider before taking or giving this medicine. If avoiding the allergen or the medicine prescribed do not work, there are many new medicines  your health care provider can prescribe. Stronger medicine may be used if initial measures are ineffective. Desensitizing injections can be used if medicine and avoidance does not work. Desensitization is when a patient is given ongoing shots until the body becomes less sensitive to the allergen. Make sure you follow up with your health care provider if problems continue. HOME CARE INSTRUCTIONS It is not possible to completely avoid allergens, but you can reduce your symptoms by taking steps to limit your exposure to them. It helps to know exactly what you are allergic to so that you can avoid your specific triggers. SEEK MEDICAL CARE IF:  You have a fever.  You develop a cough that does not stop easily (persistent).  You have shortness of breath.  You start wheezing.  Symptoms interfere with normal daily activities.   This information is not intended to replace advice given to you by your health care provider. Make sure you discuss any questions you have with your health care provider.   Document Released: 01/23/2001 Document Revised: 05/21/2014 Document Reviewed: 01/05/2013 Elsevier Interactive Patient Education 2016 [employer] Sinusitis, Adult Sinusitis is redness, soreness, and inflammation of the paranasal sinuses. Paranasal sinuses are air pockets within the bones of your face. They are located beneath your eyes, in the middle of your forehead, and above your eyes. In healthy paranasal sinuses, mucus is able to drain out, and air is able to circulate through them by way of your nose. However, when your paranasal sinuses are inflamed, mucus and air can become trapped. This can allow bacteria and other germs to grow and cause infection. Sinusitis can develop quickly and last only a short time (acute) or continue over a long   period (chronic). Sinusitis that lasts for more than 12 weeks is considered chronic. CAUSES Causes of sinusitis include:  Allergies.  Structural abnormalities,  such as displacement of the cartilage that separates your nostrils (deviated septum), which can decrease the air flow through your nose and sinuses and affect sinus drainage.  Functional abnormalities, such as when the small hairs (cilia) that line your sinuses and help remove mucus do not work properly or are not present. SIGNS AND SYMPTOMS Symptoms of acute and chronic sinusitis are the same. The primary symptoms are pain and pressure around the affected sinuses. Other symptoms include:  Upper toothache.  Earache.  Headache.  Bad breath.  Decreased sense of smell and taste.  A cough, which worsens when you are lying flat.  Fatigue.  Fever.  Thick drainage from your nose, which often is green and may contain pus (purulent).  Swelling and warmth over the affected sinuses. DIAGNOSIS Your health care provider will perform a physical exam. During your exam, your health care provider may perform any of the following to help determine if you have acute sinusitis or chronic sinusitis:  Look in your nose for signs of abnormal growths in your nostrils (nasal polyps).  Tap over the affected sinus to check for signs of infection.  View the inside of your sinuses using an imaging device that has a light attached (endoscope). If your health care provider suspects that you have chronic sinusitis, one or more of the following tests may be recommended:  Allergy tests.  Nasal culture. A sample of mucus is taken from your nose, sent to a lab, and screened for bacteria.  Nasal cytology. A sample of mucus is taken from your nose and examined by your health care provider to determine if your sinusitis is related to an allergy. TREATMENT Most cases of acute sinusitis are related to a viral infection and will resolve on their own within 10 days. Sometimes, medicines are prescribed to help relieve symptoms of both acute and chronic sinusitis. These may include pain medicines, decongestants, nasal  steroid sprays, or saline sprays. However, for sinusitis related to a bacterial infection, your health care provider will prescribe antibiotic medicines. These are medicines that will help kill the bacteria causing the infection. Rarely, sinusitis is caused by a fungal infection. In these cases, your health care provider will prescribe antifungal medicine. For some cases of chronic sinusitis, surgery is needed. Generally, these are cases in which sinusitis recurs more than 3 times per year, despite other treatments. HOME CARE INSTRUCTIONS  Drink plenty of water. Water helps thin the mucus so your sinuses can drain more easily.  Use a humidifier.  Inhale steam 3-4 times a day (for example, sit in the bathroom with the shower running).  Apply a warm, moist washcloth to your face 3-4 times a day, or as directed by your health care provider.  Use saline nasal sprays to help moisten and clean your sinuses.  Take medicines only as directed by your health care provider.  If you were prescribed either an antibiotic or antifungal medicine, finish it all even if you start to feel better. SEEK IMMEDIATE MEDICAL CARE IF:  You have increasing pain or severe headaches.  You have nausea, vomiting, or drowsiness.  You have swelling around your face.  You have vision problems.  You have a stiff neck.  You have difficulty breathing.   This information is not intended to replace advice given to you by your health care provider. Make sure you discuss   any questions you have with your health care provider.   Document Released: 04/30/2005 Document Revised: 05/21/2014 Document Reviewed: 05/15/2011 Elsevier Interactive Patient Education 2016 Elsevier Inc.  

## 2015-08-27 NOTE — ED Provider Notes (Signed)
CSN: [id number]     Arrival date & time 08/27/15  0706 History   First MD Initiated Contact with Patient 08/27/15 [id number]     Chief Complaint  Patient presents with  . Fever     (Consider location/radiation/quality/duration/timing/severity/associated sxs/prior Treatment) HPI Patient presents with 2 days of nasal congestion, diffuse body aches, scratchy throat and fever. Admits to nonproductive cough. States he was outside doing yard work yesterday. No sick contacts. Denies any shortness of breath or chest pain. States his best to be on allergy medication but is yet to fill the prescription. Past Medical History  Diagnosis Date  . Allergy    Past Surgical History  Procedure Laterality Date  . Appendectomy     No family history on file. Social History  Substance Use Topics  . Smoking status: Never Smoker   . Smokeless tobacco: Never Used  . Alcohol Use: No    Review of Systems  Constitutional: Positive for fever and fatigue. Negative for chills.  HENT: Positive for congestion, postnasal drip, rhinorrhea and sinus pressure. Negative for sore throat and trouble swallowing.   Respiratory: Positive for cough. Negative for shortness of breath and wheezing.   Cardiovascular: Negative for chest pain, palpitations and leg swelling.  Gastrointestinal: Negative for nausea, vomiting, abdominal pain, diarrhea and constipation.  Musculoskeletal: Negative for myalgias, back pain, neck pain and neck stiffness.  Skin: Negative for rash and wound.  Neurological: Negative for dizziness, weakness, light-headedness, numbness and headaches.  All other systems reviewed and are negative.     Allergies  Review of patient's allergies indicates no known allergies.  Home Medications   Prior to Admission medications   Medication Sig Start Date End Date Taking? Authorizing Provider  benzonatate (TESSALON) 100 MG capsule Take 1 capsule (100 mg total) by mouth every 8 (eight) hours. 08/27/15   Loren Racer, MD  diclofenac (VOLTAREN) 75 MG EC tablet Take 1 tablet (75 mg total) by mouth 2 (two) times daily. 08/08/15   Ramon Dredge Saguier, PA-C  fluticasone (FLONASE) 50 MCG/ACT nasal spray Place 2 sprays into both nostrils daily. 08/08/15   Ramon Dredge Saguier, PA-C  ibuprofen (ADVIL,MOTRIN) 600 MG tablet Take 1 tablet (600 mg total) by mouth every 6 (six) hours as needed. 08/27/15   Loren Racer, MD  levocetirizine (XYZAL) 5 MG tablet Take 1 tablet (5 mg total) by mouth every evening. 08/08/15   Ramon Dredge Saguier, PA-C  oxymetazoline (AFRIN NASAL SPRAY) 0.05 % nasal spray Place 1 spray into both nostrils 2 (two) times daily. 08/27/15   Loren Racer, MD  SUMAtriptan (IMITREX) 50 MG tablet Take 1 tablet (50 mg total) by mouth every 2 (two) hours as needed for migraine. May repeat in 2 hours if headache persists or recurs. Max number of tabs is 2 within any 24 hour period. 08/24/15   Edward Saguier, PA-C   BP 141/78 mmHg  Pulse 90  Temp(Src) 98.7 F (37.1 C) (Oral)  Resp 20  Ht  (1.778 m)  Wt 234 lb (106.142 kg)  BMI 33.58 kg/m2  SpO2 98% Physical Exam  Constitutional: He is oriented to person, place, and time. He appears well-developed and well-nourished. No distress.  HENT:  Head: Normocephalic and atraumatic.  Mouth/Throat: Oropharynx is clear and moist. No oropharyngeal exudate.  Bilateral nasal mucosal edema. The visualized TM due to cerumen impaction bilaterally. No definite sinus tenderness to percussion. Oropharynx is mildly erythematous. No tonsillar hypertrophy, exudates. Uvula is midline.  Eyes: EOM are normal. Pupils are equal, round, and reactive  to light.  Neck: Normal range of motion. Neck supple.  No meningismus  Cardiovascular: Normal rate and regular rhythm.  Exam reveals no gallop and no friction rub.   No murmur heard. Pulmonary/Chest: Effort normal and breath sounds normal. No respiratory distress. He has no wheezes. He has no rales. He exhibits no tenderness.   Abdominal: Soft. Bowel sounds are normal. He exhibits no distension and no mass. There is no tenderness. There is no rebound and no guarding.  Musculoskeletal: Normal range of motion. He exhibits no edema or tenderness.  No lower extremity swelling or pain.  Neurological: He is alert and oriented to person, place, and time.  Moving all extremities without deficit. Sensation is intact.  Skin: Skin is warm and dry. No rash noted. No erythema.  Psychiatric: He has a normal mood and affect. His behavior is normal.  Nursing note and vitals reviewed.   ED Course  Procedures (including critical care time) Labs Review Labs Reviewed - No data to display  Imaging Review Dg Chest 2 View  08/27/2015  CLINICAL DATA:  Cough, chest congestion and fever. EXAM: CHEST  2 VIEW COMPARISON:  None. FINDINGS: Poor inspiration. Normal sized heart. Clear lungs. Minimal central peribronchial thickening. Mild to moderate scoliosis. Mild lower thoracic spine degenerative changes. IMPRESSION: Minimal bronchitic changes. Electronically Signed   By: Beckie SaltsSteven  Reid M.D.   On: 08/27/2015 07:56   I have personally reviewed and evaluated these images and lab results as part of my medical decision-making.   EKG Interpretation None      MDM   Final diagnoses:  Allergic rhinitis due to pollen  Sinus congestion   Patient states he is feeling better. Fever has resolved. Chest x-ray without evidence of pneumonia. Patient has allergic rhinitis with possible sinusitis versus bronchitis. Do not believe that antibiotics  are necessary at this time. Return precautions given     Loren Raceravid Wilian Kwong, MD 08/27/15 [phone]

## 2015-08-27 NOTE — ED Notes (Signed)
Patient transported to X-ray 

## 2015-08-27 NOTE — ED Notes (Signed)
States began having fever yesterday, allergy type symptoms after working in the yard, some nasal congestion, body aches

## 2015-08-27 NOTE — ED Notes (Signed)
Took Tylenol, 2 500mg  tabs last PM

## 2015-09-02 MED FILL — LEVOCETIRIZINE 5 MG TABLET: 5 | 90 days supply | Qty: 90 | Fill #0

## 2015-10-14 ENCOUNTER — Ambulatory Visit (INDEPENDENT_AMBULATORY_CARE_PROVIDER_SITE_OTHER): Payer: BLUE CROSS/BLUE SHIELD | Admitting: Medical

## 2015-10-14 ENCOUNTER — Encounter: Payer: Self-pay | Admitting: Medical

## 2015-10-14 ENCOUNTER — Telehealth: Payer: Self-pay

## 2015-10-14 VITALS — BP 122/84 | HR 78 | Temp 98.1°F | Ht 70.0 in | Wt 239.0 lb

## 2015-10-14 DIAGNOSIS — R0683 Snoring: Secondary | ICD-10-CM | POA: Diagnosis not present

## 2015-10-14 NOTE — Progress Notes (Addendum)
Subjective:    Patient ID: Ian Romero, male    DOB: 02-08-1986, 30 y.o.   MRN: [id number]  HPI  Pt in with some concern of possible sleep apnea.  Pt in states he snores a lot. He states as long as he can remember. Pt wife states that he snores. Pt states sometimes he wakes up throughout the night. Sometimes hard to go back to sleep. But then he states sometimes difficult for wife to wake him up from sleep.   Pt states sometimes he sluggish after sleeping. But states if sleeps more than 6 hours feel sluggish. Then states if sleeps 4-6 hours only feels better.  Pt wife is pregnant. Pt states with baby coming if they are crying then he needs to wake up. But states he is a deep sleeper.  Sometimes he gets very sleepy during the day.States sometimes fall asleep watching tv during the day.     Review of Systems  Constitutional: Negative for chills and fatigue.  Respiratory: Negative for cough, chest tightness, shortness of breath and wheezing.   Cardiovascular: Negative for chest pain and palpitations.  Gastrointestinal: Negative for abdominal pain, diarrhea and constipation.  Genitourinary: Negative for dysuria.  Musculoskeletal: Negative for back pain.  Skin: Negative for rash.  Neurological: Negative for dizziness, speech difficulty and headaches.  Hematological: Negative for adenopathy. Does not bruise/bleed easily.  Psychiatric/Behavioral: Positive for sleep disturbance. Negative for behavioral problems and confusion.       Snoring.    Past Medical History  Diagnosis Date  . Allergy      Social History   Social History  . Marital Status: Married    Spouse Name: N/A  . Number of Children: N/A  . Years of Education: N/A   Occupational History  . Not on file.   Social History Main Topics  . Smoking status: Never Smoker   . Smokeless tobacco: Never Used  . Alcohol Use: No  . Drug Use: No  . Sexual Activity: Yes   Other Topics Concern  . Not on file   Social  History Narrative    Past Surgical History  Procedure Laterality Date  . Appendectomy      No family history on file.  No Known Allergies  Current Outpatient Prescriptions on File Prior to Visit  Medication Sig Dispense Refill  . diclofenac (VOLTAREN) 75 MG EC tablet Take 1 tablet (75 mg total) by mouth 2 (two) times daily. 30 tablet 0  . fluticasone (FLONASE) 50 MCG/ACT nasal spray Place 2 sprays into both nostrils daily. 16 g 11  . ibuprofen (ADVIL,MOTRIN) 600 MG tablet Take 1 tablet (600 mg total) by mouth every 6 (six) hours as needed. 30 tablet 0  . levocetirizine (XYZAL) 5 MG tablet Take 1 tablet (5 mg total) by mouth every evening. 90 tablet 3  . oxymetazoline (AFRIN NASAL SPRAY) 0.05 % nasal spray Place 1 spray into both nostrils 2 (two) times daily. 30 mL 0  . SUMAtriptan (IMITREX) 50 MG tablet Take 1 tablet (50 mg total) by mouth every 2 (two) hours as needed for migraine. May repeat in 2 hours if headache persists or recurs. Max number of tabs is 2 within any 24 hour period. 10 tablet 0   No current facility-administered medications on file prior to visit.    BP 122/84 mmHg  Pulse 78  Temp(Src) 98.1 F (36.7 C) (Oral)  Ht  (1.778 m)  Wt 239 lb (108.41 kg)  BMI 34.29 kg/m2  SpO2  98%       Objective:   Physical Exam  General Mental Status- Alert. General Appearance- Not in acute distress.   Skin General: Color- Normal Color. Moisture- Normal Moisture.  Neck Carotid Arteries- Normal color. Moisture- Normal Moisture. No carotid bruits. No JVD.  Chest and Lung Exam Auscultation: Breath Sounds:-Normal.  Cardiovascular Auscultation:Rythm- Regular. Murmurs & Other Heart Sounds:Auscultation of the heart reveals- No Murmurs.  Abdomen Inspection:-Inspeection Normal. Palpation/Percussion:Note:No mass. Palpation and Percussion of the abdomen reveal- Non Tender, Non Distended + BS, no rebound or guarding.   Neurologic Cranial Nerve exam:- CN III-XII  intact(No nystagmus), symmetric smile. Strength:- 5/5 equal and symmetric strength both upper and lower extremities.     Assessment & Plan:  For your snoring will go ahead and refer you to pulmonologist for evaluation of sleep apnea. You may need/get sleep study.  I think over long term some weight loss 10-15 pounds  may be beneficial for overall health and may help decrease snoring .  Pulmonologist or our office will call you on the referral. If no one calls you then please call our office and ask to speak with Victorino DikeJennifer.  Follow up as needed before or after pulmonologist appointment.  Tiannah Greenly, Kateri McEdward, PA-C  Heidy Mccubbin, Ramon DredgeEdward, PA-C

## 2015-10-14 NOTE — Progress Notes (Signed)
Pre visit review using our clinic review tool, if applicable. No additional management support is needed unless otherwise documented below in the visit note. 

## 2015-10-14 NOTE — Telephone Encounter (Signed)
Spoke with TD. [facility] Spoke with pt. He has been scheduled with [facility] on 10/18/15 at 4:30pm. He has been asked to arrive at 3:45pm per TD.

## 2015-10-14 NOTE — Patient Instructions (Addendum)
For your snoring will go ahead and refer you to pulmonologist for evaluation of sleep apnea. You may need/get sleep study.  I think over long term some weight loss 10-15 pounds may be beneficial for overall health and may help decrease snoring .  Pulmonologist or our office will call you on the referral. If no one calls you then please call our office and ask to speak with Ian Romero.  Follow up as needed before or after pulmonologist appointment.

## 2015-10-18 ENCOUNTER — Encounter: Payer: Self-pay | Admitting: Pulmonary Disease

## 2015-10-18 ENCOUNTER — Ambulatory Visit (INDEPENDENT_AMBULATORY_CARE_PROVIDER_SITE_OTHER): Payer: BLUE CROSS/BLUE SHIELD | Admitting: Pulmonary Disease

## 2015-10-18 VITALS — BP 138/82 | HR 76 | Ht 70.0 in | Wt 245.0 lb

## 2015-10-18 DIAGNOSIS — R0683 Snoring: Secondary | ICD-10-CM

## 2015-10-18 NOTE — Progress Notes (Signed)
Past Surgical History He  has past surgical history that includes Appendectomy and Neck surgery (2008/2009).  No Known Allergies  Family History His family history includes Allergies in his mother; Asthma in his paternal grandfather.  Social History He  reports that he has never smoked. He has never used smokeless tobacco. He reports that he does not drink alcohol or use illicit drugs.  Review of systems Constitutional: Negative for fever and unexpected weight change.  HENT: Positive for dental problem. Negative for congestion, ear pain, nosebleeds, postnasal drip, rhinorrhea, sinus pressure, sneezing, sore throat and trouble swallowing.   Eyes: Negative for redness and itching.  Respiratory: Negative for cough, chest tightness, shortness of breath and wheezing.   Cardiovascular: Negative for palpitations and leg swelling.  Gastrointestinal: Negative for nausea and vomiting.  Genitourinary: Negative for dysuria.  Musculoskeletal: Negative for joint swelling.  Skin: Negative for rash.  Neurological: Positive for headaches.  Hematological: Does not bruise/bleed easily.  Psychiatric/Behavioral: Negative for dysphoric mood. The patient is not nervous/anxious.     Current Outpatient Prescriptions on File Prior to Visit  Medication Sig  . diclofenac (VOLTAREN) 75 MG EC tablet Take 1 tablet (75 mg total) by mouth 2 (two) times daily.  . fluticasone (FLONASE) 50 MCG/ACT nasal spray Place 2 sprays into both nostrils daily.  Marland Kitchen. ibuprofen (ADVIL,MOTRIN) 600 MG tablet Take 1 tablet (600 mg total) by mouth every 6 (six) hours as needed.  Marland Kitchen. levocetirizine (XYZAL) 5 MG tablet Take 1 tablet (5 mg total) by mouth every evening.  Marland Kitchen. oxymetazoline (AFRIN NASAL SPRAY) 0.05 % nasal spray Place 1 spray into both nostrils 2 (two) times daily.  . SUMAtriptan (IMITREX) 50 MG tablet Take 1 tablet (50 mg total) by mouth every 2 (two) hours as needed for migraine. May repeat in 2 hours if headache persists or  recurs. Max number of tabs is 2 within any 24 hour period.   No current facility-administered medications on file prior to visit.    Chief Complaint  Patient presents with  . SLEEP CONSULT    Referred by Dr Alvira MondaySaguier. Epworth Score: 17    Tests:  Past medical history He  has a past medical history of Allergy and Chronic headaches.  Vital signs BP 138/82 mmHg  Pulse 76  Ht 5\' 10"  (1.778 m)  Wt 245 lb (111.131 kg)  BMI 35.15 kg/m2  SpO2 97%  History of Present Illness Ian Romero is a 30 y.o. male for evaluation of sleep problems.  His wife has been concerned about his sleep.  She says he snores, and will stop breathing while asleep.  He is a very heavy sleeper, and she has to hit him to wake up and breath.  He is always tired during the day.  He can fall asleep anywhere when he sits down.  He goes to sleep at 1 am.  He falls asleep in 5 minutes.  He wakes up 2 or 3 times during the night, but quickly falls back to sleep.  He gets out of bed at 8 am.  He feels tired in the morning.  He does get migraine headaches.  He does not use anything to help him fall sleep or stay awake.  He denies sleep walking, sleep talking, bruxism, or nightmares.  There is no history of restless legs.  He denies sleep hallucinations, sleep paralysis, or cataplexy.  The Epworth score is 17 out of 24.   Physical Exam:  General - No distress ENT - No sinus tenderness,  boggy nasal mucosa, no oral exudate, no LAN, no thyromegaly, TM clear, pupils equal/reactive, high arched palate, MP 3, triangular uvula, decrease AP diameter Cardiac - s1s2 regular, no murmur, pulses symmetric Chest - No wheeze/rales/dullness, good air entry, normal respiratory excursion Back - No focal tenderness Abd - Soft, non-tender, no organomegaly, + bowel sounds Ext - No edema Neuro - Normal strength, cranial nerves intact Skin - No rashes Psych - Normal mood, and behavior  Discussion: He has snoring, sleep disruption,  daytime sleepiness and witnessed apnea.  His BMI is > 35.  I am concerned he could have sleep apnea.  We discussed how sleep apnea can affect various health problems, including risks for hypertension, cardiovascular disease, and diabetes.  We also discussed how sleep disruption can increase risks for accidents, such as while driving.  Weight loss as a means of improving sleep apnea was also reviewed.  Additional treatment options discussed were CPAP therapy, oral appliance, and surgical intervention.   Assessment/plan:  Snoring. - will arrange for home sleep study to further assess, pending insurance approval   Patient Instructions  Will arrange for home sleep study Will call to arrange for follow up after sleep study reviewed      Coralyn Helling, M.D. Pager [phone] 10/18/2015, 4:26 PM

## 2015-10-18 NOTE — Progress Notes (Signed)
   Subjective:    Patient ID: Ian GlassingGregory Romero, male    DOB: 06/06/1985, 30 y.o.   MRN: [id number]  HPI    Review of Systems  Constitutional: Negative for fever and unexpected weight change.  HENT: Positive for dental problem. Negative for congestion, ear pain, nosebleeds, postnasal drip, rhinorrhea, sinus pressure, sneezing, sore throat and trouble swallowing.   Eyes: Negative for redness and itching.  Respiratory: Negative for cough, chest tightness, shortness of breath and wheezing.   Cardiovascular: Negative for palpitations and leg swelling.  Gastrointestinal: Negative for nausea and vomiting.  Genitourinary: Negative for dysuria.  Musculoskeletal: Negative for joint swelling.  Skin: Negative for rash.  Neurological: Positive for headaches.  Hematological: Does not bruise/bleed easily.  Psychiatric/Behavioral: Negative for dysphoric mood. The patient is not nervous/anxious.        Objective:   Physical Exam        Assessment & Plan:

## 2015-10-18 NOTE — Patient Instructions (Signed)
Will arrange for home sleep study Will call to arrange for follow up after sleep study reviewed  

## 2015-11-03 DIAGNOSIS — G4733 Obstructive sleep apnea (adult) (pediatric): Secondary | ICD-10-CM | POA: Diagnosis not present

## 2015-11-04 ENCOUNTER — Telehealth: Payer: Self-pay | Admitting: Pulmonary Disease

## 2015-11-04 NOTE — Telephone Encounter (Signed)
HST 11/03/15 >> AHI 9, SpO2 low 83%.  Will have my nurse inform pt that sleep study shows mild sleep apnea.  Options are 1) CPAP now, 2) ROV first.  If He is agreeable to CPAP, then please send order for auto CPAP range 5 to 15 cm H2O with heated humidity and mask of choice.  Have download sent 1 month after starting CPAP and set up ROV 2 months after starting CPAP.  ROV can be with me or nurse practitioner.

## 2015-11-09 DIAGNOSIS — G4733 Obstructive sleep apnea (adult) (pediatric): Secondary | ICD-10-CM | POA: Diagnosis not present

## 2015-11-09 NOTE — Telephone Encounter (Signed)
Patient notified of results. Patient chose to schedule ROV to discuss options for treatment. Scheduled patient to see TP on 11/14/15 at 10:15. Patient aware of appointment. Nothing further needed.

## 2015-11-10 ENCOUNTER — Other Ambulatory Visit: Payer: Self-pay | Admitting: *Deleted

## 2015-11-10 DIAGNOSIS — R0683 Snoring: Secondary | ICD-10-CM

## 2015-11-14 ENCOUNTER — Encounter: Payer: Self-pay | Admitting: Adult Health

## 2015-11-14 ENCOUNTER — Ambulatory Visit (INDEPENDENT_AMBULATORY_CARE_PROVIDER_SITE_OTHER): Payer: BLUE CROSS/BLUE SHIELD | Admitting: Adult Health

## 2015-11-14 VITALS — BP 124/80 | HR 62 | Temp 97.7°F | Ht 70.0 in | Wt 240.0 lb

## 2015-11-14 DIAGNOSIS — G4733 Obstructive sleep apnea (adult) (pediatric): Secondary | ICD-10-CM | POA: Diagnosis not present

## 2015-11-14 NOTE — Patient Instructions (Addendum)
Begin CPAP At bedtime   Wear for at least 4-6hr each night.  Work on weight loss Do not drive if sleepy .  Follow in 6 weeks with Dr. Craige CottaSood  Or Parrett NP once CPAP has started.

## 2015-11-14 NOTE — Assessment & Plan Note (Signed)
Mild OSA  Plan  Begin CPAP At bedtime   Wear for at least 4-6hr each night.  Work on weight loss Do not drive if sleepy .  Follow in 6 weeks with Dr. Craige CottaSood  Or Parrett NP once CPAP has started.

## 2015-11-14 NOTE — Progress Notes (Signed)
Subjective:    Patient ID: Ian Romero, male    DOB: 10/02/1985, 30 y.o.   MRN: [id number]  HPI 30 yo male seen for sleep consult 10/18/15 for possible OSA  TEST  HST 11/03/15 >> AHI 9, SpO2 low 83%.  11/14/2015 Follow up :  Pt returns for 1 month follow up .  Seen for sleep consult 10/18/15 for daytime sleepiness and snoring.  HST showed mild OSA with AHI 9 , saO2 at 83%.  We discussed treatment options including weight loss and CPAP  Would like to try CPAP.  Denies chest pain, orthopnea , edema or fever    Past Medical History  Diagnosis Date  . Allergy   . Chronic headaches    Current Outpatient Prescriptions on File Prior to Visit  Medication Sig Dispense Refill  . diclofenac (VOLTAREN) 75 MG EC tablet Take 1 tablet (75 mg total) by mouth 2 (two) times daily. 30 tablet 0  . fluticasone (FLONASE) 50 MCG/ACT nasal spray Place 2 sprays into both nostrils daily. 16 g 11  . ibuprofen (ADVIL,MOTRIN) 600 MG tablet Take 1 tablet (600 mg total) by mouth every 6 (six) hours as needed. 30 tablet 0  . levocetirizine (XYZAL) 5 MG tablet Take 1 tablet (5 mg total) by mouth every evening. 90 tablet 3  . SUMAtriptan (IMITREX) 50 MG tablet Take 1 tablet (50 mg total) by mouth every 2 (two) hours as needed for migraine. May repeat in 2 hours if headache persists or recurs. Max number of tabs is 2 within any 24 hour period. 10 tablet 0  . oxymetazoline (AFRIN NASAL SPRAY) 0.05 % nasal spray Place 1 spray into both nostrils 2 (two) times daily. (Patient not taking: Reported on 11/14/2015) 30 mL 0   No current facility-administered medications on file prior to visit.      Review of Systems Constitutional:   No  weight loss, night sweats,  Fevers, chills,  +fatigue, or  lassitude.  HEENT:   No headaches,  Difficulty swallowing,  Tooth/dental problems, or  Sore throat,                No sneezing, itching, ear ache, nasal congestion, post nasal drip,   CV:  No chest pain,  Orthopnea, PND, swelling  in lower extremities, anasarca, dizziness, palpitations, syncope.   GI  No heartburn, indigestion, abdominal pain, nausea, vomiting, diarrhea, change in bowel habits, loss of appetite, bloody stools.   Resp: No shortness of breath with exertion or at rest.  No excess mucus, no productive cough,  No non-productive cough,  No coughing up of blood.  No change in color of mucus.  No wheezing.  No chest wall deformity  Skin: no rash or lesions.  GU: no dysuria, change in color of urine, no urgency or frequency.  No flank pain, no hematuria   MS:  No joint pain or swelling.  No decreased range of motion.  No back pain.  Psych:  No change in mood or affect. No depression or anxiety.  No memory loss.         Objective:   Physical Exam  Filed Vitals:   11/14/15 1041  BP: 124/80  Pulse: 62  Temp: 97.7 F (36.5 C)  TempSrc: Oral  Height: 5\' 10"  (1.778 m)  Weight: 240 lb (108.863 kg)  SpO2: 99%  .Body mass index is 34.44 kg/(m^2).   GEN: A/Ox3; pleasant , NAD, obese   HEENT:  [place]/AT,  EACs-clear, TMs-wnl, NOSE-clear, THROAT-clear, no lesions,  no postnasal drip or exudate noted.   NECK:  Supple w/ fair ROM; no JVD; normal carotid impulses w/o bruits; no thyromegaly or nodules palpated; no lymphadenopathy.  RESP  Clear  P & A; w/o, wheezes/ rales/ or rhonchi.no accessory muscle use, no dullness to percussion  CARD:  RRR, no m/r/g  , no peripheral edema, pulses intact, no cyanosis or clubbing.  GI:   Soft & nt; nml bowel sounds; no organomegaly or masses detected.  Musco: Warm bil, no deformities or joint swelling noted.   Neuro: alert, no focal deficits noted.    Skin: Warm, no lesions or rashes  Esthefany Herrig NP-C  [facility]  11/14/2015       Assessment & Plan:

## 2015-11-14 NOTE — Addendum Note (Signed)
Addended by: Reynaldo MiniumWELCHEL, KATIE C on: 11/14/2015 11:08 AM   Modules accepted: Orders

## 2015-11-20 NOTE — Progress Notes (Signed)
Reviewed and agree with assessment/plan.  Coralyn HellingVineet Magalie Almon, MD [facility] 11/20/2015, 6:50 PM Pager:  [phone]

## 2016-03-16 MED FILL — LEVOCETIRIZINE 5 MG TABLET: 5 | 90 days supply | Qty: 90 | Fill #1

## 2016-04-25 ENCOUNTER — Ambulatory Visit (INDEPENDENT_AMBULATORY_CARE_PROVIDER_SITE_OTHER): Payer: BLUE CROSS/BLUE SHIELD | Admitting: Medical

## 2016-04-25 ENCOUNTER — Encounter: Payer: Self-pay | Admitting: Medical

## 2016-04-25 VITALS — BP 118/78 | HR 78 | Temp 98.3°F | Ht 70.0 in | Wt 239.6 lb

## 2016-04-25 DIAGNOSIS — J019 Acute sinusitis, unspecified: Secondary | ICD-10-CM

## 2016-04-25 DIAGNOSIS — J209 Acute bronchitis, unspecified: Secondary | ICD-10-CM | POA: Diagnosis not present

## 2016-04-25 DIAGNOSIS — J040 Acute laryngitis: Secondary | ICD-10-CM

## 2016-04-25 MED ORDER — HYDROCODONE-HOMATROPINE 5-1.5 MG/5ML PO SYRP: 5.0000 mL | ORAL_SOLUTION | Freq: Three times a day (TID) | ORAL | 0 refills | Status: DC | PRN

## 2016-04-25 MED ORDER — FLUTICASONE PROPIONATE 50 MCG/ACT NA SUSP: 2.0000 | Freq: Every day | NASAL | 1 refills | Status: DC

## 2016-04-25 MED ORDER — AZITHROMYCIN 250 MG PO TABS: ORAL_TABLET | ORAL | 0 refills | Status: DC

## 2016-04-25 NOTE — Progress Notes (Signed)
Subjective:    Patient ID: Ian Romero, male    DOB: 10/02/1985, 30 y.o.   MRN: [id number]  HPI  Pt in for one week of feeling sick. Started cough, nasal congestion, runny nose and hoarse voice. Some uncontrollable cough at time at night. No wheezing. Some sinus pressure and productive cough.   Review of Systems  Constitutional: Negative for chills, fatigue and fever.  HENT: Positive for congestion, postnasal drip, sinus pain, sinus pressure and voice change. Negative for sore throat and trouble swallowing.   Respiratory: Positive for cough. Negative for shortness of breath and wheezing.   Cardiovascular: Negative for chest pain and palpitations.  Gastrointestinal: Negative for abdominal pain, diarrhea and nausea.  Musculoskeletal: Negative for back pain, gait problem, myalgias and neck stiffness.       At times rare intermitent sharp pain back and upper chest when he coughs. Not every time and when does occur lasts for one second.  Skin: Negative for rash.  Neurological: Negative for dizziness and headaches.  Hematological: Negative for adenopathy. Does not bruise/bleed easily.  Psychiatric/Behavioral: Negative for behavioral problems and confusion.    Past Medical History:  Diagnosis Date  . Allergy   . Chronic headaches      Social History   Social History  . Marital status: Married    Spouse name: N/A  . Number of children: N/A  . Years of education: N/A   Occupational History  . unemployed    Social History Main Topics  . Smoking status: Never Smoker  . Smokeless tobacco: Never Used  . Alcohol use No  . Drug use: No  . Sexual activity: Yes   Other Topics Concern  . Not on file   Social History Narrative  . No narrative on file    Past Surgical History:  Procedure Laterality Date  . APPENDECTOMY    . NECK SURGERY  2008/2009    Family History  Problem Relation Age of Onset  . Allergies Mother   . Asthma Paternal Grandfather     No Known  Allergies  Current Outpatient Prescriptions on File Prior to Visit  Medication Sig Dispense Refill  . diclofenac (VOLTAREN) 75 MG EC tablet Take 1 tablet (75 mg total) by mouth 2 (two) times daily. 30 tablet 0  . ibuprofen (ADVIL,MOTRIN) 600 MG tablet Take 1 tablet (600 mg total) by mouth every 6 (six) hours as needed. 30 tablet 0  . levocetirizine (XYZAL) 5 MG tablet Take 1 tablet (5 mg total) by mouth every evening. 90 tablet 3  . oxymetazoline (AFRIN NASAL SPRAY) 0.05 % nasal spray Place 1 spray into both nostrils 2 (two) times daily. 30 mL 0   No current facility-administered medications on file prior to visit.     BP 118/78 (BP Location: Left Arm, Patient Position: Sitting, Cuff Size: Normal)   Pulse 78   Temp 98.3 F (36.8 C) (Oral)   Ht 5\' 10"  (1.778 m)   Wt 239 lb 9.6 oz (108.7 kg)   SpO2 98%   BMI 34.38 kg/m       Objective:   Physical Exam  General  Mental Status - Alert. General Appearance - Well groomed. Not in acute distress.(hoarse voice)  Skin Rashes- No Rashes.  HEENT Head- Normal. Ear Auditory Canal - Left- Normal. Right - Normal.Tympanic Membrane- Left- Normal. Right- Normal. Eye Sclera/Conjunctiva- Left- Normal. Right- Normal. Nose & Sinuses Nasal Mucosa- Left-  Boggy and Congested. Right-  Boggy and  Congested.Bilateral maxillary and frontal sinus  pressure. Mouth & Throat Lips: Upper Lip- Normal: no dryness, cracking, pallor, cyanosis, or vesicular eruption. Lower Lip-Normal: no dryness, cracking, pallor, cyanosis or vesicular eruption. Buccal Mucosa- Bilateral- No Aphthous ulcers. Oropharynx- No Discharge or Erythema. Tonsils: Characteristics- Bilateral- No Erythema or Congestion. Size/Enlargement- Bilateral- No enlargement. Discharge- bilateral-None.  Neck Neck- Supple. No Masses.   Chest and Lung Exam Auscultation: Breath Sounds:-Clear even and unlabored.  Cardiovascular Auscultation:Rythm- Regular, rate and rhythm. Murmurs & Other Heart  Sounds:Ausculatation of the heart reveal- No Murmurs.  Lymphatic Head & Neck General Head & Neck Lymphatics: Bilateral: Description- No Localized lymphadenopathy.       Assessment & Plan:   You appear to have bronchitis and sinusitis. Rest hydrate and tylenol for fever. I am prescribing cough medicine hycodan, and azithromcyin antibiotic. For your nasal congestion use flonase.  For your laryngitis rest your voice.   You should gradually get better. If not then notify us and would recommend a chest xray.  Counseled on costochondritis type pain vs pain that would need office evaluation(potential cardiac in nature)  Follow up in 7-10 days or as needed

## 2016-04-25 NOTE — Patient Instructions (Signed)
You appear to have bronchitis and sinusitis. Rest hydrate and tylenol for fever. I am prescribing cough medicine hycodan, and azithromcyin antibiotic. For your nasal congestion use flonase.  For your laryngitis rest your voice.   You should gradually get better. If not then notify us and would recommend a chest xray.  Follow up in 7-10 days or as needed

## 2017-03-06 ENCOUNTER — Encounter: Payer: Self-pay | Admitting: Medical

## 2017-03-06 ENCOUNTER — Ambulatory Visit (INDEPENDENT_AMBULATORY_CARE_PROVIDER_SITE_OTHER): Payer: BLUE CROSS/BLUE SHIELD | Admitting: Medical

## 2017-03-06 VITALS — BP 135/76 | HR 75 | Temp 98.5°F | Resp 16 | Ht 70.0 in | Wt 235.2 lb

## 2017-03-06 DIAGNOSIS — S46812A Strain of other muscles, fascia and tendons at shoulder and upper arm level, left arm, initial encounter: Secondary | ICD-10-CM

## 2017-03-06 DIAGNOSIS — M25512 Pain in left shoulder: Secondary | ICD-10-CM | POA: Diagnosis not present

## 2017-03-06 DIAGNOSIS — B084 Enteroviral vesicular stomatitis with exanthem: Secondary | ICD-10-CM

## 2017-03-06 MED ORDER — CYCLOBENZAPRINE HCL 10 MG PO TABS: 10.0000 mg | ORAL_TABLET | Freq: Every day | ORAL | 0 refills | Status: DC

## 2017-03-06 NOTE — Patient Instructions (Addendum)
You do appear to have had hand-foot and mouth disease recently.  The lesions look like they are almost completely healed.  The attached sheet explains the diagnosis.  I expect over the next 3-4 days the remaining lesions on left foot will heal completely.  Regarding the prior rash to your hands 2-3 months ago, it is hard to state what that could have been.  There are varying diagnoses to consider.  If you have recurrent hand rash in the future you could try to my chart me a picture and I might be able to give an opinion provided that the picture resolution is good.  Also I might actually ask you to come in office after reviewing the my chart message.  For left shoulder pain and trapezius pain, you can continue low-dose ibuprofen and I will prescribe Flexeril today to use at night.  Please also get left shoulder x-ray today.  If pain and shoulder or trapezius persisting for more than another week would consider referral to sports medicine.  Follow-up in 1-2 weeks or as needed.  Hand, Foot, and Mouth Disease, Adult Hand, foot, and mouth disease is a common viral illness. It happens mainly in children who are younger than 65 years old, but adolescents and adults may also get it. The illness often causes:  Sore throat.  Sores in the mouth.  Fever.  Rash on the hands and feet.  Usually, this condition is not serious. Most people get better within 1-2 weeks. What are the causes? This condition is usually caused by a group of viruses called enteroviruses. The disease can spread from person to person (is contagious). A person is most contagious during the first week of the illness. The infection spreads through direct contact with:  Nose discharge of an infected person.  Throat discharge of an infected person.  Stool (feces) of an infected person.  What are the signs or symptoms? Symptoms of this condition include:  Small sores in the mouth. These may cause pain.  A rash on the hands and  feet, and sometimes on the buttocks. The rash may also occur on the arms, legs, or other areas of the body. The rash may look like small red bumps or sores and may have blisters.  Fever.  Body aches or headaches.  Irritability.  Decreased appetite.  How is this diagnosed? This condition can usually be diagnosed with a physical exam in which your health care provider will look at your rash and mouth sores. Tests are usually not needed. In some cases, a stool (feces) sample or a throat swab may be taken to check for the virus or for other infections. How is this treated? In most cases, no treatment is needed. People usually get better within 2 weeks without treatment. To help relieve pain or fever, your health care provider may recommend over-the-counter medicines such as ibuprofen or acetaminophen. To help relieve discomfort from mouth sores, your health care provider may recommend using:  Solutions that are rinsed in the mouth.  Pain-relieving gel that is applied to the sores (topical gel).  Antacid medicine.  Follow these instructions at home: Managing pain and discomfort  Rinse your mouth with a salt-water mixture 3-4 times a day or as needed. To make a salt-water mixture, completely dissolve -1 tsp of salt in 1 cup of warm water. This can help to reduce pain from the mouth sores. Your health care provider may also recommend other rinse solutions to treat mouth sores.  To relieve discomfort  when you are eating: ? Try combinations of foods to see what you can tolerate. Aim for a balanced diet. ? Eat soft foods. These may be easier to swallow. ? Avoid foods and drinks that are salty, spicy, or acidic. ? Avoid alcohol. ? Try cold food and drinks, such as water, milk, milkshakes, frozen ice pops, slushies, and sherbets. Low-calorie sport drinks are good choices for staying hydrated. General instructions  Return to your normal activities as told by your health care provider. Ask your  health care provider what activities are safe for you.  Take or apply over-the-counter and prescription medicines only as told by your health care provider.  Wash your hands often with soap and water. If soap and water are not available, use hand sanitizer.  Stay away from work, schools, or other group settings during the first few days of the illness, or until your fever is gone.  Keep all follow-up visits as told by your health care provider. This is important. Contact a health care provider if:  Your symptoms get worse or do not improve within 2 weeks.  You have pain that does not get better with medicine.  You feel very irritable.  You have trouble swallowing.  You develop sores or blisters on your lips or outside of your mouth.  You have a fever for more than 3 days. Get help right away if:  You develop signs of severe dehydration, such as: ? Decreased urination. This means urinating only very small amounts or urinating fewer than 3 times in a 24-hour period. ? Urine that is very dark. ? Dry mouth, tongue, or lips. ? Decreased tears or sunken eyes. ? Dry skin. ? Rapid breathing. ? Decreased activity or being very sleepy. ? Pale skin. ? Fingertips taking longer than 2 seconds to turn pink after a gentle squeeze. ? Weight loss.  You have a severe headache.  You have a stiff neck.  You experience changes in your behavior.  You have chest pain.  You have trouble breathing. Summary  Hand, foot, and mouth disease is a common viral illness.  This disease can spread from person to person (is contagious).  The illness often causes a sore throat, sores in the mouth, fever, and a rash on the hands and feet.  Typically, no treatment is needed for this condition. People usually get better within 2 weeks without treatment.  Get help right away if you develop signs of severe dehydration. This information is not intended to replace advice given to you by your health care  provider. Make sure you discuss any questions you have with your health care provider. Document Released: 06/18/2016 Document Revised: 06/18/2016 Document Reviewed: 06/18/2016 Elsevier Interactive Patient Education  2018 [employer]

## 2017-03-06 NOTE — Progress Notes (Signed)
Subjective:    Patient ID: Ian Romero, male    DOB: 1986/03/06, 31 y.o.   MRN: [id number]  HPI  Pt in for recent small blister outbreak of both hands and some blisters to both feet. Pt young boys both had hand foot and mouth disease recently.   Pt states his kids got over hand, foot and mouth about 5 days before he had outbreak.  Pt mentioned in remote past about 3 months ago he had some rash to his hands between fingers. Very briefly for day or two.  Pt also mentions occasional mild pain left shoulder pain for 2 weeks. Some mild neck pain left side. No trauma or fall. Pt not working out. Points to left trapezius and anterior shoulder.  Review of Systems  Constitutional: Negative for chills, fatigue and unexpected weight change.  Respiratory: Negative for cough, chest tightness, shortness of breath and wheezing.   Cardiovascular: Negative for chest pain and palpitations.  Gastrointestinal: Negative for abdominal pain.  Genitourinary: Negative for dysuria.  Musculoskeletal: Positive for neck pain. Negative for back pain.       See hpi.  Skin: Negative for rash.  Neurological: Negative for dizziness and headaches.  Hematological: Negative for adenopathy. Does not bruise/bleed easily.  Psychiatric/Behavioral: Negative for behavioral problems and confusion.   Past Medical History:  Diagnosis Date  . Allergy   . Chronic headaches      Social History   Social History  . Marital status: Married    Spouse name: N/A  . Number of children: N/A  . Years of education: N/A   Occupational History  . unemployed    Social History Main Topics  . Smoking status: Never Smoker  . Smokeless tobacco: Never Used  . Alcohol use No  . Drug use: No  . Sexual activity: Yes   Other Topics Concern  . Not on file   Social History Narrative  . No narrative on file    Past Surgical History:  Procedure Laterality Date  . APPENDECTOMY    . NECK SURGERY  2008/2009    Family History    Problem Relation Age of Onset  . Allergies Mother   . Asthma Paternal Grandfather     No Known Allergies  Current Outpatient Prescriptions on File Prior to Visit  Medication Sig Dispense Refill  . diclofenac (VOLTAREN) 75 MG EC tablet Take 1 tablet (75 mg total) by mouth 2 (two) times daily. 30 tablet 0  . fluticasone (FLONASE) 50 MCG/ACT nasal spray Place 2 sprays into both nostrils daily. 16 g 1  . HYDROcodone-homatropine (HYCODAN) 5-1.5 MG/5ML syrup Take 5 mLs by mouth every 8 (eight) hours as needed for cough. 120 mL 0  . ibuprofen (ADVIL,MOTRIN) 600 MG tablet Take 1 tablet (600 mg total) by mouth every 6 (six) hours as needed. 30 tablet 0  . levocetirizine (XYZAL) 5 MG tablet Take 1 tablet (5 mg total) by mouth every evening. 90 tablet 3  . oxymetazoline (AFRIN NASAL SPRAY) 0.05 % nasal spray Place 1 spray into both nostrils 2 (two) times daily. 30 mL 0   No current facility-administered medications on file prior to visit.     BP 135/76   Pulse 75   Temp 98.5 F (36.9 C) (Oral)   Resp 16   Ht 5\' 10"  (1.778 m)   Wt 235 lb 3.2 oz (106.7 kg)   SpO2 98%   BMI 33.75 kg/m       Objective:   Physical Exam  General- No acute distress. Pleasant patient. Neck- Full range of motion, no jvd. Mild trapezius tenderness to palpation. Lungs- Clear, even and unlabored. Heart- regular rate and rhythm. Neurologic- CNII- XII grossly intact.  Mouth- no ulcers on gums. No lesions. Skin- on palms patient has healing wounds from prior scattered vesicles.  Rt foot bottom aspect still some small blisters. Left foot- only scarring healed lesion.  Shoulder- good rom. No crepitus. Mild anterior aspect tenderness/        Assessment & Plan:  You do appear to have had hand-foot and mouth disease recently.  The lesions look like they are almost completely healed.  The attached sheet explains the diagnosis.  I expect over the next 3-4 days the remaining lesions on left foot will heal  completely.  Regarding the prior rash to your hands 2-3 months ago, it is hard to state what that could have been.  There are varying diagnoses to consider.  If you have recurrent hand rash in the future you could try to my chart me a picture and I might be able to give an opinion provided that the picture resolution is good.  Also I might actually ask you to come in office after reviewing the my chart message.  For left shoulder pain and trapezius pain, you can continue low-dose ibuprofen and I will prescribe Flexeril today to use at night.  Please also get left shoulder x-ray today.  If pain and shoulder or trapezius persisting for more than another week would consider referral to sports medicine.  Follow-up in 1-2 weeks or as needed.  Demontrez Rindfleisch, Ramon DredgeEdward, PA-C

## 2017-04-11 ENCOUNTER — Ambulatory Visit (HOSPITAL_BASED_OUTPATIENT_CLINIC_OR_DEPARTMENT_OTHER)
Admission: RE | Admit: 2017-04-11 | Discharge: 2017-04-11 | Disposition: A | Discharge status: Home / Self Care | Payer: BLUE CROSS/BLUE SHIELD | Source: Ambulatory Visit | Attending: Medical | Admitting: Medical

## 2017-04-11 ENCOUNTER — Other Ambulatory Visit: Payer: Self-pay | Admitting: Medical

## 2017-04-11 ENCOUNTER — Encounter: Payer: Self-pay | Admitting: Medical

## 2017-04-11 DIAGNOSIS — M25512 Pain in left shoulder: Secondary | ICD-10-CM | POA: Diagnosis present

## 2017-04-12 ENCOUNTER — Telehealth: Payer: Self-pay | Admitting: Medical

## 2017-04-12 DIAGNOSIS — M25512 Pain in left shoulder: Secondary | ICD-10-CM

## 2017-04-12 NOTE — Telephone Encounter (Signed)
Referral to sports med placed.

## 2017-04-29 ENCOUNTER — Ambulatory Visit: Payer: BLUE CROSS/BLUE SHIELD | Admitting: Family Medicine

## 2017-04-29 MED ORDER — DICLOFENAC SODIUM 75 MG PO TBEC: 75.0000 mg | DELAYED_RELEASE_TABLET | Freq: Two times a day (BID) | ORAL | 0 refills | Status: DC

## 2017-04-29 NOTE — Telephone Encounter (Signed)
Please advise 

## 2017-04-29 NOTE — Telephone Encounter (Signed)
It looks like I have not seen him anytime recently.  It looks like I saw him 2 months ago.  You did not referred him to sports medicine.  Also sounds like he wants higher dose of Flexeril.  Hycodan was loaded but that the cough medicine?  In any event, I think he needs an office visit with me.  Try to give him a convenient time 8-9 am or 1-2 pm to avoid any potential long wait.

## 2017-04-30 NOTE — Telephone Encounter (Signed)
[facility]

## 2017-04-30 NOTE — Telephone Encounter (Signed)
Please call and schedule appointment.

## 2017-05-01 ENCOUNTER — Ambulatory Visit (INDEPENDENT_AMBULATORY_CARE_PROVIDER_SITE_OTHER): Payer: BLUE CROSS/BLUE SHIELD | Admitting: Family Medicine

## 2017-05-01 ENCOUNTER — Encounter: Payer: Self-pay | Admitting: Family Medicine

## 2017-05-01 DIAGNOSIS — M25512 Pain in left shoulder: Secondary | ICD-10-CM

## 2017-05-01 NOTE — Addendum Note (Signed)
Addended by: Kathi SimpersWISE, Kennetta Pavlovic F on: 05/01/2017 01:28 PM   Modules accepted: Orders

## 2017-05-01 NOTE — Progress Notes (Signed)
PCP and consultation requested by: Esperanza RichtersSaguier, Edward, PA-C  Subjective:   HPI: Patient is a 31 y.o. male here for left shoulder pain.  Patient reports for about 2 months now he has had superior left shoulder/neck pain. No acute injury. Tightness felt in this area at 4/10 level but can get up to 8/10 at worst. Some localized numbness. Sometimes at night left arm will feel asleep. Worse with turning head to the right side. No benefit with muscle relaxant. Taking tylenol. No skin changes. No radiation, bowel/bladder dysfunction.  Past Medical History:  Diagnosis Date  . Allergy   . Chronic headaches     Current Outpatient Medications on File Prior to Visit  Medication Sig Dispense Refill  . cyclobenzaprine (FLEXERIL) 10 MG tablet Take 1 tablet (10 mg total) by mouth at bedtime. 7 tablet 0  . diclofenac (VOLTAREN) 75 MG EC tablet Take 1 tablet (75 mg total) by mouth 2 (two) times daily. 30 tablet 0  . fluticasone (FLONASE) 50 MCG/ACT nasal spray Place 2 sprays into both nostrils daily. 16 g 1  . HYDROcodone-homatropine (HYCODAN) 5-1.5 MG/5ML syrup Take 5 mLs by mouth every 8 (eight) hours as needed for cough. 120 mL 0  . ibuprofen (ADVIL,MOTRIN) 600 MG tablet Take 1 tablet (600 mg total) by mouth every 6 (six) hours as needed. 30 tablet 0  . levocetirizine (XYZAL) 5 MG tablet Take 1 tablet (5 mg total) by mouth every evening. 90 tablet 3  . oxymetazoline (AFRIN NASAL SPRAY) 0.05 % nasal spray Place 1 spray into both nostrils 2 (two) times daily. 30 mL 0   No current facility-administered medications on file prior to visit.     Past Surgical History:  Procedure Laterality Date  . APPENDECTOMY    . NECK SURGERY  2008/2009    No Known Allergies  Social History   Socioeconomic History  . Marital status: Married    Spouse name: Not on file  . Number of children: Not on file  . Years of education: Not on file  . Highest education level: Not on file  Social Needs  . Financial  resource strain: Not on file  . Food insecurity - worry: Not on file  . Food insecurity - inability: Not on file  . Transportation needs - medical: Not on file  . Transportation needs - non-medical: Not on file  Occupational History  . Occupation: unemployed  Tobacco Use  . Smoking status: Never Smoker  . Smokeless tobacco: Never Used  Substance and Sexual Activity  . Alcohol use: No    Alcohol/week: 0.0 oz  . Drug use: No  . Sexual activity: Yes  Other Topics Concern  . Not on file  Social History Narrative  . Not on file    Family History  Problem Relation Age of Onset  . Allergies Mother   . Asthma Paternal Grandfather     BP (!) 163/84   Pulse 76   Ht 5\' 10"  (1.778 m)   Wt 234 lb (106.1 kg)   BMI 33.58 kg/m   Review of Systems: See HPI above.     Objective:  Physical Exam:  Gen: NAD, comfortable in exam room  Neck: No gross deformity, swelling, bruising. TTP left trapezius, cervical paraspinal region.  No midline/bony TTP. FROM but pain with extension and left lateral rotation. BUE strength 5/5.   Sensation intact to light touch.   Negative spurlings. Negative allen's. NV intact distal BUEs.  Left shoulder: No swelling, ecchymoses.  No gross  deformity. No TTP. FROM. Negative Hawkins, Neers. Negative Yergasons. Strength 5/5 with empty can and resisted internal/external rotation. Negative apprehension. NV intact distally.   Assessment & Plan:  1. Left neck/shoulder pain - 2/2 trapezius strain/spasms.  Start aleve or ibuprofen for 7-10 days then as needed.  Heat.  Start physical therapy and do home exercises on days he doesn't go to therapy.  Discussed ergonomic issues.  F/u in 6 weeks.

## 2017-05-01 NOTE — Assessment & Plan Note (Signed)
2/2 trapezius strain/spasms.  Start aleve or ibuprofen for 7-10 days then as needed.  Heat.  Start physical therapy and do home exercises on days he doesn't go to therapy.  Discussed ergonomic issues.  F/u in 6 weeks.

## 2017-05-01 NOTE — Patient Instructions (Signed)
You have a trapezius strain/spasms. Aleve 2 tabs twice a day with food OR ibuprofen 600mg  three times a day with food for pain and inflammation - typically take for 7-10 days then as needed. Heat 15 minutes at a time 3-4 times a day. Start physical therapy and do home exercises on days you don't go to therapy. Watch head position when on computers, texting, when sleeping in bed - should in line with back to prevent further spasms. Follow up with me in 6 weeks.

## 2017-06-11 ENCOUNTER — Ambulatory Visit: Payer: BLUE CROSS/BLUE SHIELD | Admitting: Family Medicine

## 2017-07-10 ENCOUNTER — Encounter: Payer: Self-pay | Admitting: Medical

## 2017-07-10 ENCOUNTER — Ambulatory Visit: Payer: Self-pay | Admitting: Medical

## 2017-07-10 ENCOUNTER — Ambulatory Visit (HOSPITAL_BASED_OUTPATIENT_CLINIC_OR_DEPARTMENT_OTHER)
Admission: RE | Admit: 2017-07-10 | Discharge: 2017-07-10 | Disposition: A | Discharge status: Home / Self Care | Payer: BLUE CROSS/BLUE SHIELD | Source: Ambulatory Visit | Attending: Medical | Admitting: Medical

## 2017-07-10 ENCOUNTER — Ambulatory Visit (INDEPENDENT_AMBULATORY_CARE_PROVIDER_SITE_OTHER): Payer: BLUE CROSS/BLUE SHIELD | Admitting: Medical

## 2017-07-10 VITALS — BP 130/80 | HR 66 | Temp 98.2°F | Resp 16 | Ht 70.0 in | Wt 244.4 lb

## 2017-07-10 DIAGNOSIS — M542 Cervicalgia: Secondary | ICD-10-CM | POA: Diagnosis not present

## 2017-07-10 DIAGNOSIS — M792 Neuralgia and neuritis, unspecified: Secondary | ICD-10-CM | POA: Insufficient documentation

## 2017-07-10 DIAGNOSIS — M898X1 Other specified disorders of bone, shoulder: Secondary | ICD-10-CM

## 2017-07-10 DIAGNOSIS — M47812 Spondylosis without myelopathy or radiculopathy, cervical region: Secondary | ICD-10-CM | POA: Diagnosis not present

## 2017-07-10 DIAGNOSIS — Z8739 Personal history of other diseases of the musculoskeletal system and connective tissue: Secondary | ICD-10-CM | POA: Insufficient documentation

## 2017-07-10 MED ORDER — KETOROLAC TROMETHAMINE 30 MG/ML IJ SOLN
30.0000 mg | Freq: Once | INTRAMUSCULAR | Status: AC
  Administered 2017-07-10: 30 mg via INTRAMUSCULAR

## 2017-07-10 MED ORDER — TRAMADOL HCL 50 MG PO TABS
50.0000 mg | ORAL_TABLET | Freq: Four times a day (QID) | ORAL | 0 refills | Status: DC | PRN
Start: 2017-07-10 — End: 2021-02-22

## 2017-07-10 MED ORDER — MELOXICAM 15 MG PO TABS: 15.0000 mg | ORAL_TABLET | Freq: Every day | ORAL | 0 refills | Status: DC

## 2017-07-10 MED ORDER — CYCLOBENZAPRINE HCL 10 MG PO TABS: 10.0000 mg | ORAL_TABLET | Freq: Every day | ORAL | 0 refills | Status: DC

## 2017-07-10 NOTE — Patient Instructions (Addendum)
For your recent trapezius pain, neck pain, radicular pain and history of former cervical spine surgery, we are getting x-ray of the cervical spine today.  We gave you Toradol 30 mg IM injection office.  I am prescribing meloxicam NSAID but wait until tomorrow morning before starting meloxicam  Tonight you can use Flexeril muscle relaxant.  For moderate-to-severe pain I am prescribing tramadol.  Please sign a release of information form so we can get records from prior neurosurgeon.  This might be needed in the event you need future neurosurgeon referral.  We will follow the x-ray studies and see how you do with the above treatment.  See if we need to refer you to sports medicine or another neurosurgeon.  Follow-up in 7-10 days or as needed.

## 2017-07-10 NOTE — Progress Notes (Signed)
Subjective:    Patient ID: Ian Romero, male    DOB: 04/04/1986, 32 y.o.   MRN: [id number]  HPI  Pt in with some left periscapular area pain that started about 10 days. Pt has history of neck pain and trapezius pain but that pain seems to have gotten better.   Pt states pain from left scapula region shoot toward his shoulder and all the way down to his left hand. Pt thumb, index and middle finger feels numb decreased sensation.   Pain in scapula and radiating pain down left arm worse with movement.  No chest pain.  Pt states pain moderate to severe.  Pt has history of herniated disk in neck. He had surgery when he was 32 year old.   Review of Systems  Constitutional: Negative for chills, fatigue and fever.  HENT: Negative for congestion, ear discharge and ear pain.   Respiratory: Negative for cough, chest tightness, shortness of breath and wheezing.   Cardiovascular: Negative for chest pain and palpitations.  Gastrointestinal: Negative for abdominal pain.  Musculoskeletal: Positive for neck pain. Negative for back pain, gait problem, myalgias and neck stiffness.       Scapula pain.  Also radicular pain noted as explained in HPI.  Skin: Negative for rash.  Hematological: Negative for adenopathy. Does not bruise/bleed easily.  Psychiatric/Behavioral: Negative for behavioral problems, confusion and sleep disturbance. The patient is not nervous/anxious.     Past Medical History:  Diagnosis Date  . Allergy   . Chronic headaches      Social History   Socioeconomic History  . Marital status: Married    Spouse name: Not on file  . Number of children: Not on file  . Years of education: Not on file  . Highest education level: Not on file  Social Needs  . Financial resource strain: Not on file  . Food insecurity - worry: Not on file  . Food insecurity - inability: Not on file  . Transportation needs - medical: Not on file  . Transportation needs - non-medical: Not on file    Occupational History  . Occupation: unemployed  Tobacco Use  . Smoking status: Never Smoker  . Smokeless tobacco: Never Used  Substance and Sexual Activity  . Alcohol use: No    Alcohol/week: 0.0 oz  . Drug use: No  . Sexual activity: Yes  Other Topics Concern  . Not on file  Social History Narrative  . Not on file    Past Surgical History:  Procedure Laterality Date  . APPENDECTOMY    . NECK SURGERY  2008/2009    Family History  Problem Relation Age of Onset  . Allergies Mother   . Asthma Paternal Grandfather     No Known Allergies  Current Outpatient Medications on File Prior to Visit  Medication Sig Dispense Refill  . cyclobenzaprine (FLEXERIL) 10 MG tablet Take 1 tablet (10 mg total) by mouth at bedtime. 7 tablet 0  . diclofenac (VOLTAREN) 75 MG EC tablet Take 1 tablet (75 mg total) by mouth 2 (two) times daily. 30 tablet 0  . fluticasone (FLONASE) 50 MCG/ACT nasal spray Place 2 sprays into both nostrils daily. 16 g 1  . HYDROcodone-homatropine (HYCODAN) 5-1.5 MG/5ML syrup Take 5 mLs by mouth every 8 (eight) hours as needed for cough. 120 mL 0  . ibuprofen (ADVIL,MOTRIN) 600 MG tablet Take 1 tablet (600 mg total) by mouth every 6 (six) hours as needed. 30 tablet 0  . levocetirizine (XYZAL) 5 MG  tablet Take 1 tablet (5 mg total) by mouth every evening. 90 tablet 3  . oxymetazoline (AFRIN NASAL SPRAY) 0.05 % nasal spray Place 1 spray into both nostrils 2 (two) times daily. 30 mL 0   No current facility-administered medications on file prior to visit.     BP (!) 141/99   Pulse 66   Temp 98.2 F (36.8 C) (Oral)   Resp 16   Ht 5\' 10"  (1.778 m)   Wt 244 lb 6.4 oz (110.9 kg)   SpO2 98%   BMI 35.07 kg/m       Objective:   Physical Exam   General- No acute distress. Pleasant patient. Neck- Full range of motion, no jvd. If points chin down or looks up seems to reproduce radicular pain. Lungs- Clear, even and unlabored. Heart- regular rate and  rhythm. Neurologic- CNII- XII grossly intact.  Left upper extremity- normal strength. But left thumb and 3rd digit pad decreased sensation.  Scapula-.   Superior portion of the scapula is moderately tender to palpation.     Assessment & Plan:  For your recent trapezius pain, neck pain, radicular pain and history of former cervical spine surgery, we are getting x-ray of the cervical spine today.  We gave you Toradol 30 mg IM injection office.  I am prescribing meloxicam NSAID but wait until tomorrow morning before starting meloxicam.  Tonight you can use Flexeril muscle relaxant.  For moderate-to-severe pain I am prescribing tramadol.  Please sign a release of information form so we can get records from prior neurosurgeon.  This might be needed in the event you need future neurosurgeon referral.  We will follow the x-ray studies and see how you do with the above treatment.  See if we need to refer you to sports medicine or another neurosurgeon.  Follow-up in 7-10 days or as needed.  Esperanza Richters, PA-C

## 2017-07-11 ENCOUNTER — Ambulatory Visit: Payer: Self-pay | Admitting: Medical

## 2017-07-14 ENCOUNTER — Encounter: Payer: Self-pay | Admitting: Medical

## 2017-07-15 ENCOUNTER — Telehealth: Payer: Self-pay | Admitting: Medical

## 2017-07-15 NOTE — Telephone Encounter (Signed)
was able to get the information regarding the neurologist who performed my surgery. It is:   Dr Shireen QuanQuoc-Anh Thai  [phone] .   [address]   [place], [place] [address]    This is Address and number to neurosurgeon who did surgery on his neck before.  I think he already signed a release of information form.  He sent this information at my request.  Would you send out that request form so we can get those old records.  If he did not sign that release form page can you have him sign and send that off.

## 2017-07-16 NOTE — Telephone Encounter (Signed)
Will you call and have him come by to sign.  Thanks.

## 2017-07-16 NOTE — Telephone Encounter (Signed)
Patient does not have a Release form signed on file.

## 2017-07-23 ENCOUNTER — Encounter: Payer: Self-pay | Admitting: Medical

## 2017-07-24 ENCOUNTER — Telehealth: Payer: Self-pay | Admitting: Medical

## 2017-07-24 MED ORDER — GABAPENTIN 100 MG PO CAPS: 100.0000 mg | ORAL_CAPSULE | Freq: Three times a day (TID) | ORAL | 0 refills | Status: DC

## 2017-07-24 NOTE — Telephone Encounter (Signed)
Prescription of gabapentin sent to patient's pharmacy.

## 2017-07-26 ENCOUNTER — Telehealth: Payer: Self-pay | Admitting: Medical

## 2017-07-26 NOTE — Telephone Encounter (Signed)
I was able to find the doctor, they said to fax them at this number and ask for the form below:    [phone]      We have been trying to get patient's old records from his former neurosurgeon.  He did find the fax number.  He may have already signed a release of information form.  If he did we go ahead and send that fax out.  If he did not go ahead and sign the form then have him come in to sign the form so we can send the request out.  Thanks

## 2017-07-26 NOTE — Telephone Encounter (Signed)
Yes I'm sorry the number is [phone] and his name is Dr. Shireen QuanQuoc-Anh Thai.   This is the phone number and name of Dr. that we need to send release form to.

## 2017-07-30 NOTE — Telephone Encounter (Signed)
LVM for patient to stop by office to sign records of release form for Neurosurgeon.

## 2017-08-02 ENCOUNTER — Telehealth: Payer: Self-pay

## 2017-08-02 NOTE — Telephone Encounter (Signed)
Copied from CRM [phone]. Topic: General - Other >> Aug 01, 2017  5:25 PM Raquel SarnaHayes, Teresa G wrote: [facility] [phone]- [phone] Open until 12 noon  Records sent on pt.  They have a question on the records. Please call to discuss.

## 2017-08-03 NOTE — Telephone Encounter (Signed)
See notes on this pt. Will you call this number and inquire what question they have? Let me know. This would be helpful as typically busy on mondays.

## 2017-08-05 NOTE — Telephone Encounter (Signed)
Call [facility] left voice to call back.

## 2017-08-07 ENCOUNTER — Other Ambulatory Visit: Payer: Self-pay | Admitting: Medical

## 2017-08-27 ENCOUNTER — Other Ambulatory Visit: Payer: Self-pay | Admitting: Medical

## 2017-08-28 ENCOUNTER — Other Ambulatory Visit: Payer: Self-pay | Admitting: Medical

## 2017-08-28 MED FILL — FLUTICASONE PROP 50 MCG SPR: 50 | 30 days supply | Qty: 16 | Fill #0

## 2017-08-29 ENCOUNTER — Other Ambulatory Visit: Payer: Self-pay | Admitting: Medical

## 2018-02-04 IMAGING — DX DG SHOULDER 2+V*L*
3 series · 3 of 3 positions shown · non-contrast
Comparison: None.

CLINICAL DATA: 31-year-old male with left shoulder pain for 2
weeks.

EXAM:
LEFT SHOULDER - 2+ VIEW

[shoulder axillary]
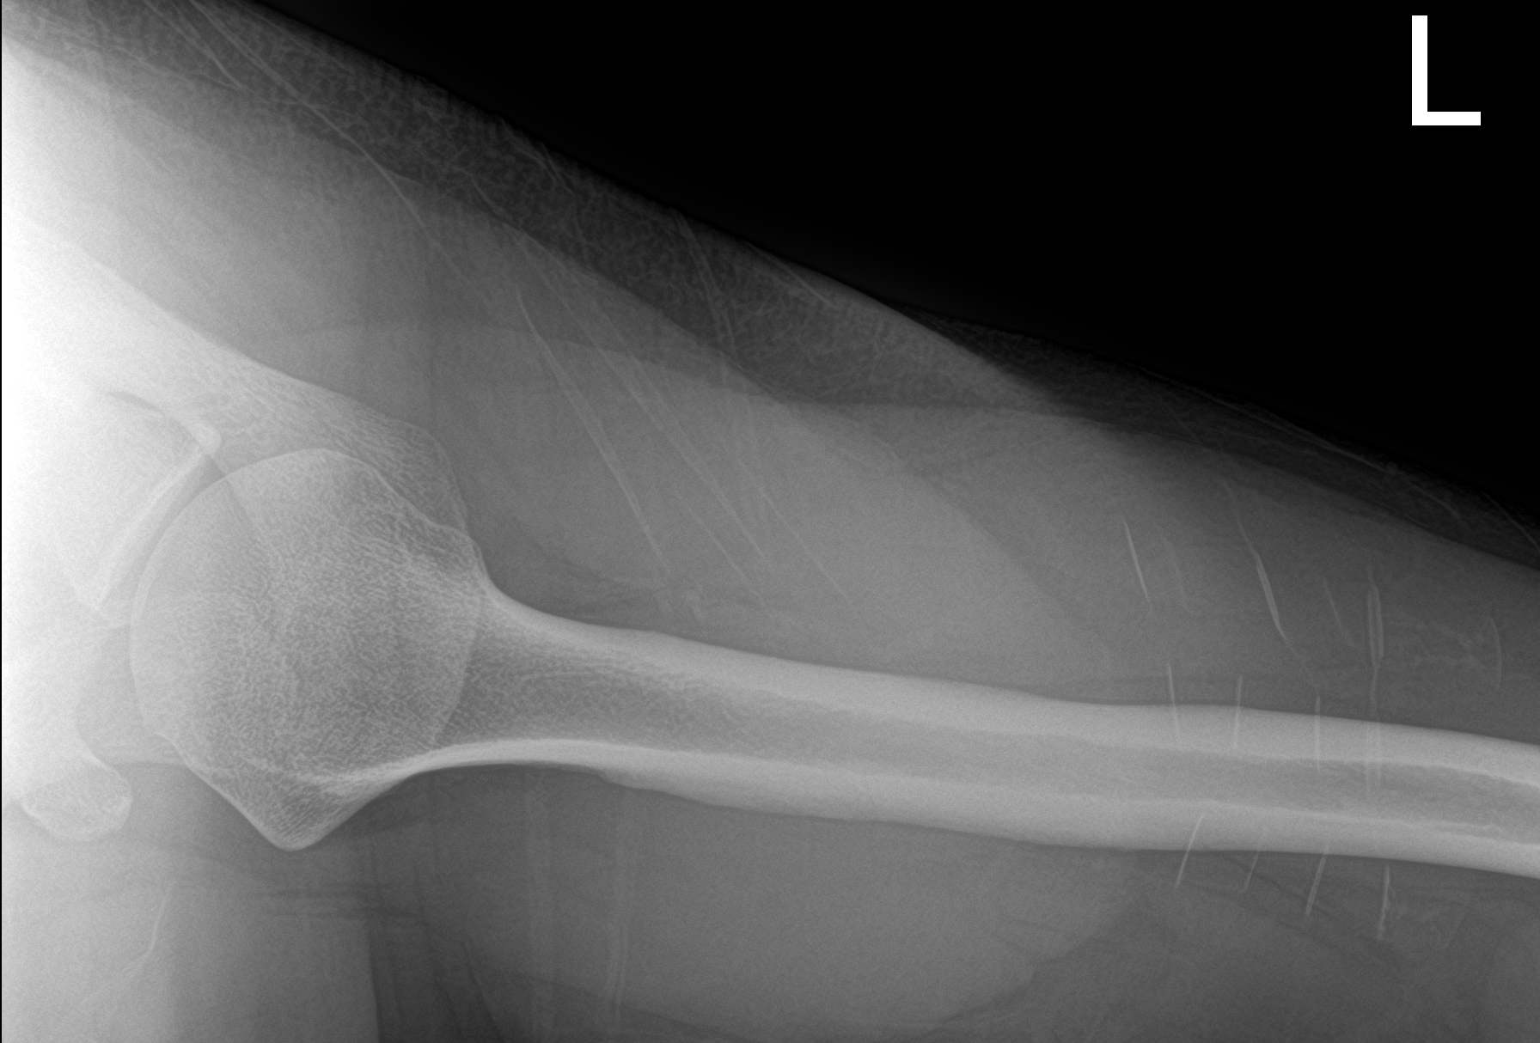

[shoulder grashey]
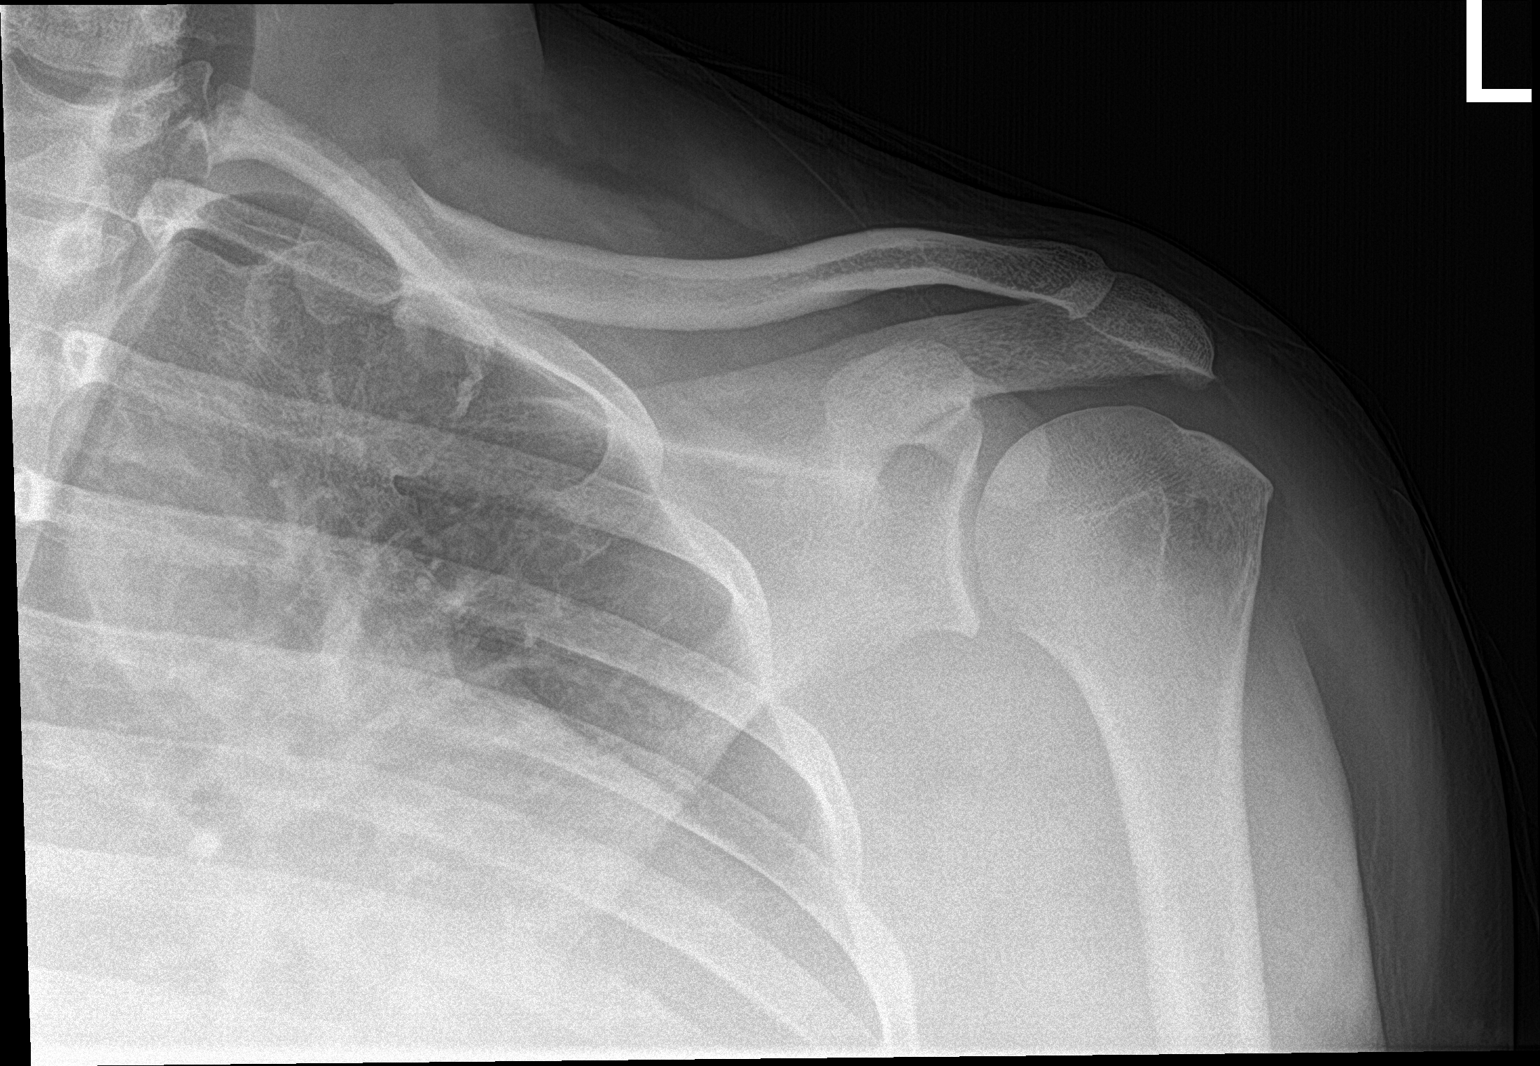

[shoulder y view]
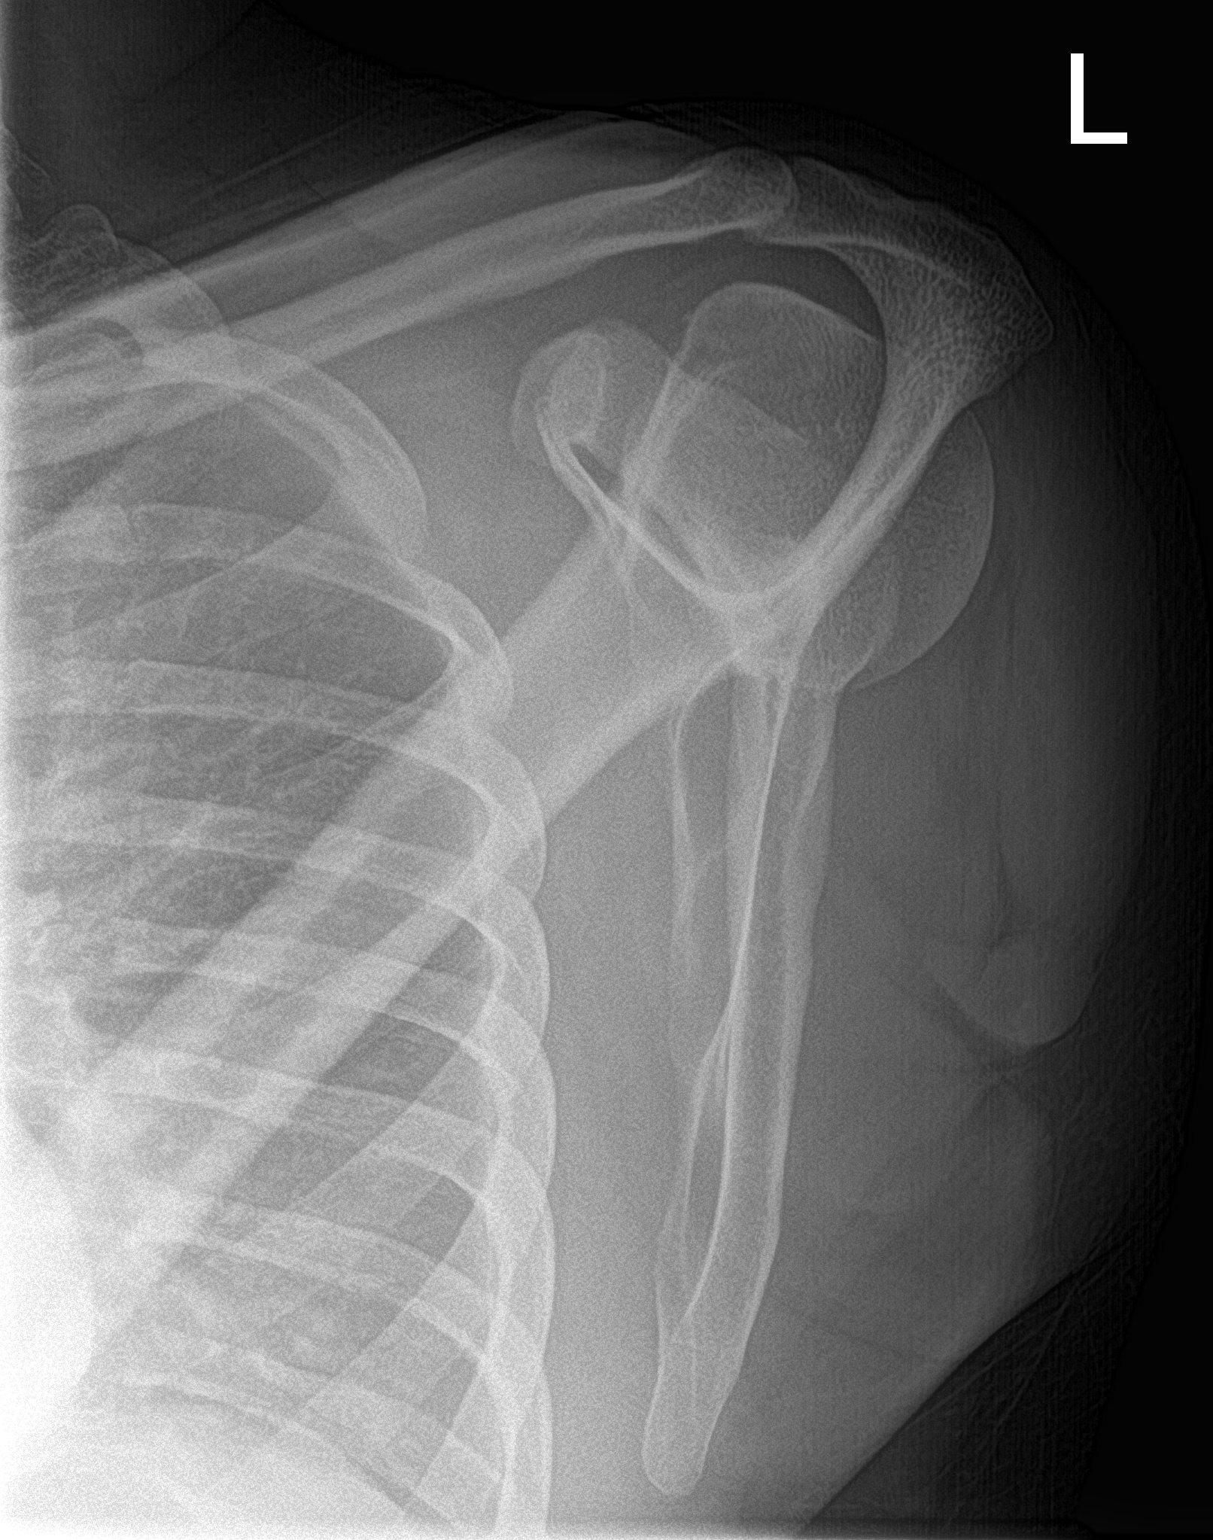

[3 of 3 positions shown; findings below may reference images not displayed]

FINDINGS: There is no evidence of fracture or dislocation. There is no
evidence of arthropathy or other focal bone abnormality. Soft
tissues are unremarkable.
IMPRESSION: Negative.

## 2018-02-13 ENCOUNTER — Encounter: Payer: Self-pay | Admitting: Medical

## 2018-02-13 ENCOUNTER — Ambulatory Visit (INDEPENDENT_AMBULATORY_CARE_PROVIDER_SITE_OTHER): Payer: BLUE CROSS/BLUE SHIELD | Admitting: Medical

## 2018-02-13 ENCOUNTER — Telehealth: Payer: Self-pay | Admitting: Medical

## 2018-02-13 ENCOUNTER — Ambulatory Visit: Payer: BLUE CROSS/BLUE SHIELD | Admitting: Medical

## 2018-02-13 ENCOUNTER — Ambulatory Visit (HOSPITAL_BASED_OUTPATIENT_CLINIC_OR_DEPARTMENT_OTHER)
Admission: RE | Admit: 2018-02-13 | Discharge: 2018-02-13 | Disposition: A | Discharge status: Home / Self Care | Payer: BLUE CROSS/BLUE SHIELD | Source: Ambulatory Visit | Attending: Medical | Admitting: Medical

## 2018-02-13 VITALS — BP 137/85 | HR 79 | Temp 98.6°F | Resp 16 | Ht 70.0 in | Wt 251.6 lb

## 2018-02-13 DIAGNOSIS — R059 Cough, unspecified: Secondary | ICD-10-CM

## 2018-02-13 DIAGNOSIS — R918 Other nonspecific abnormal finding of lung field: Secondary | ICD-10-CM | POA: Diagnosis not present

## 2018-02-13 DIAGNOSIS — J40 Bronchitis, not specified as acute or chronic: Secondary | ICD-10-CM

## 2018-02-13 DIAGNOSIS — R0982 Postnasal drip: Secondary | ICD-10-CM | POA: Diagnosis not present

## 2018-02-13 DIAGNOSIS — R05 Cough: Secondary | ICD-10-CM | POA: Insufficient documentation

## 2018-02-13 MED ORDER — HYDROCODONE-HOMATROPINE 5-1.5 MG/5ML PO SYRP: 5.0000 mL | ORAL_SOLUTION | Freq: Three times a day (TID) | ORAL | 0 refills | Status: DC | PRN

## 2018-02-13 MED ORDER — AMOXICILLIN-POT CLAVULANATE 875-125 MG PO TABS: 1.0000 | ORAL_TABLET | Freq: Two times a day (BID) | ORAL | 0 refills | Status: DC

## 2018-02-13 MED ORDER — AZITHROMYCIN 250 MG PO TABS: ORAL_TABLET | ORAL | 0 refills | Status: DC

## 2018-02-13 MED ORDER — LEVOCETIRIZINE DIHYDROCHLORIDE 5 MG PO TABS: 5.0000 mg | ORAL_TABLET | Freq: Every evening | ORAL | 0 refills | Status: DC

## 2018-02-13 NOTE — Patient Instructions (Addendum)
You appear to have bronchitis. Rest hydrate and tylenol for fever. I am prescribing cough medicine hycodan, and azithromycin antibiotic. Continue flonase spray.  I do think with your allergic rhinitis hx best for you to start back on xyzal as pnd present on exam.  Recommend  chest xray today.  Follow up in 7-10 days or as needed

## 2018-02-13 NOTE — Progress Notes (Signed)
Subjective:    Patient ID: Ian Romero, male    DOB: 11-19-1985, 32 y.o.   MRN: [id number]  HPI  Pt in for 3 weeks of recent cough. Day and night cough. Cough is worse at night. No fever, no chills or sweats. No wheezing. No sneezing. No itching eyes or runny nose.   Pt states trying otc cough meds but not helping.  Pt has been using flonase but has not been using xyzal.     Review of Systems  Constitutional: Negative for chills, fatigue and fever.  HENT: Positive for postnasal drip. Negative for congestion, ear pain, nosebleeds and rhinorrhea.        Pt does have some seasnonal allergies but he does not report obvious flare.  Respiratory: Positive for cough. Negative for shortness of breath and wheezing.   Cardiovascular: Negative for chest pain and palpitations.  Gastrointestinal: Negative for abdominal pain.  Hematological: Negative for adenopathy. Does not bruise/bleed easily.    Past Medical History:  Diagnosis Date  . Allergy   . Chronic headaches      Social History   Socioeconomic History  . Marital status: Married    Spouse name: Not on file  . Number of children: Not on file  . Years of education: Not on file  . Highest education level: Not on file  Occupational History  . Occupation: unemployed  Social Needs  . Financial resource strain: Not on file  . Food insecurity:    Worry: Not on file    Inability: Not on file  . Transportation needs:    Medical: Not on file    Non-medical: Not on file  Tobacco Use  . Smoking status: Never Smoker  . Smokeless tobacco: Never Used  Substance and Sexual Activity  . Alcohol use: No    Alcohol/week: 0.0 standard drinks  . Drug use: No  . Sexual activity: Yes  Lifestyle  . Physical activity:    Days per week: Not on file    Minutes per session: Not on file  . Stress: Not on file  Relationships  . Social connections:    Talks on phone: Not on file    Gets together: Not on file    Attends religious service:  Not on file    Active member of club or organization: Not on file    Attends meetings of clubs or organizations: Not on file    Relationship status: Not on file  . Intimate partner violence:    Fear of current or ex partner: Not on file    Emotionally abused: Not on file    Physically abused: Not on file    Forced sexual activity: Not on file  Other Topics Concern  . Not on file  Social History Narrative  . Not on file    Past Surgical History:  Procedure Laterality Date  . APPENDECTOMY    . NECK SURGERY  2008/2009    Family History  Problem Relation Age of Onset  . Allergies Mother   . Asthma Paternal Grandfather     No Known Allergies  Current Outpatient Medications on File Prior to Visit  Medication Sig Dispense Refill  . cyclobenzaprine (FLEXERIL) 10 MG tablet Take 1 tablet (10 mg total) by mouth at bedtime. 10 tablet 0  . diclofenac (VOLTAREN) 75 MG EC tablet Take 1 tablet (75 mg total) by mouth 2 (two) times daily. 30 tablet 0  . fluticasone (FLONASE) 50 MCG/ACT nasal spray Place 2 sprays into both  nostrils daily. 16 g 1  . fluticasone (FLONASE) 50 MCG/ACT nasal spray PLACE 2 SPRAYS INTO BOTH NOSTRILS DAILY. 16 g 11  . gabapentin (NEURONTIN) 100 MG capsule TAKE 1 CAPSULE BY MOUTH THREE TIMES A DAY 90 capsule 0  . ibuprofen (ADVIL,MOTRIN) 600 MG tablet Take 1 tablet (600 mg total) by mouth every 6 (six) hours as needed. 30 tablet 0  . levocetirizine (XYZAL) 5 MG tablet TAKE 1 TABLET (5 MG TOTAL) BY MOUTH EVERY EVENING. 90 tablet 3  . meloxicam (MOBIC) 15 MG tablet TAKE 1 TABLET BY MOUTH EVERY DAY 30 tablet 0  . oxymetazoline (AFRIN NASAL SPRAY) 0.05 % nasal spray Place 1 spray into both nostrils 2 (two) times daily. 30 mL 0  . traMADol (ULTRAM) 50 MG tablet Take 1 tablet (50 mg total) by mouth every 6 (six) hours as needed. 20 tablet 0   No current facility-administered medications on file prior to visit.     BP 137/85   Pulse 79   Temp 98.6 F (37 C) (Oral)    Resp 16   Ht 5\' 10"  (1.778 m)   Wt 251 lb 9.6 oz (114.1 kg)   SpO2 100%   BMI 36.10 kg/m       Objective:   Physical Exam  General  Mental Status - Alert. General Appearance - Well groomed. Not in acute distress.  Skin Rashes- No Rashes.  HEENT Head- Normal. Ear Auditory Canal - Left- Normal. Right - Normal.Tympanic Membrane- Left- Normal. Right- Normal. Eye Sclera/Conjunctiva- Left- Normal. Right- Normal. Nose & Sinuses Nasal Mucosa- Left-  Boggy and Congested. Right-  Boggy and  Congested.Bilateral no  maxillary and no  frontal sinus pressure. Mouth & Throat Lips: Upper Lip- Normal: no dryness, cracking, pallor, cyanosis, or vesicular eruption. Lower Lip-Normal: no dryness, cracking, pallor, cyanosis or vesicular eruption. Buccal Mucosa- Bilateral- No Aphthous ulcers. Oropharynx- No Discharge or Erythema. Tonsils: Characteristics- Bilateral- No Erythema or Congestion. Size/Enlargement- Bilateral- No enlargement. Discharge- bilateral-None.  Neck Neck- Supple. No Masses.   Chest and Lung Exam Auscultation: Breath Sounds:-Clear even and unlabored.  Cardiovascular Auscultation:Rythm- Regular, rate and rhythm. Murmurs & Other Heart Sounds:Ausculatation of the heart reveal- No Murmurs.  Lymphatic Head & Neck General Head & Neck Lymphatics: Bilateral: Description- No Localized lymphadenopathy.       Assessment & Plan:  You appear to have bronchitis. Rest hydrate and tylenol for fever. I am prescribing cough medicine hycodan, and azithromycin antibiotic. Continue flonase spray.  I do think with your allergic rhinitis hx best for you to start back on xyzal as pnd present on exam.  Recommend  chest xray today.  Follow up in 7-10 days or as needed  [employer], [employer]

## 2018-02-13 NOTE — Telephone Encounter (Signed)
Rx augmentin sent to pharmacy.  

## 2018-03-31 ENCOUNTER — Encounter: Payer: Self-pay | Admitting: Medical

## 2018-05-05 ENCOUNTER — Encounter: Payer: Self-pay | Admitting: Family Medicine

## 2018-05-05 ENCOUNTER — Ambulatory Visit (INDEPENDENT_AMBULATORY_CARE_PROVIDER_SITE_OTHER): Payer: BLUE CROSS/BLUE SHIELD | Admitting: Family Medicine

## 2018-05-05 VITALS — BP 140/110 | HR 76 | Temp 98.9°F | Ht 70.0 in | Wt 251.8 lb

## 2018-05-05 DIAGNOSIS — R059 Cough, unspecified: Secondary | ICD-10-CM

## 2018-05-05 DIAGNOSIS — R05 Cough: Secondary | ICD-10-CM

## 2018-05-05 MED ORDER — AZITHROMYCIN 250 MG PO TABS: ORAL_TABLET | ORAL | 0 refills | Status: DC

## 2018-05-05 NOTE — Progress Notes (Signed)
Ian GlassingGregory Justiniano - 32 y.o. male MRN [id number]  Date of birth: 09/27/1985  SUBJECTIVE:  Including CC & ROS.  Chief Complaint  Patient presents with  . Cough    congestion x1 month    Ian GlassingGregory Romero is a 32 y.o. male that is presenting with cough congestion.  He feels like his symptoms started around Thanksgiving and had some improvement.  He feels like they have returned though.  He feels sinus pressure and has rhinorrhea.  He has a cough that is worse at night.  Denies any tobacco use.  Has tried over-the-counter medications with limited improvement.  Denies any fevers or chills   Review of Systems  Constitutional: Negative for fever.  HENT: Positive for congestion and rhinorrhea.   Respiratory: Positive for cough.   Cardiovascular: Negative for chest pain.  Gastrointestinal: Negative for abdominal pain.      HISTORY: Past Medical, Surgical, Social, and Family History Reviewed & Updated per EMR.   Pertinent Historical Findings include:  Past Medical History:  Diagnosis Date  . Allergy   . Chronic headaches     Past Surgical History:  Procedure Laterality Date  . APPENDECTOMY    . NECK SURGERY  2008/2009    No Known Allergies  Family History  Problem Relation Age of Onset  . Allergies Mother   . Asthma Paternal Grandfather      Social History   Socioeconomic History  . Marital status: Married    Spouse name: Not on file  . Number of children: Not on file  . Years of education: Not on file  . Highest education level: Not on file  Occupational History  . Occupation: unemployed  Social Needs  . Financial resource strain: Not on file  . Food insecurity:    Worry: Not on file    Inability: Not on file  . Transportation needs:    Medical: Not on file    Non-medical: Not on file  Tobacco Use  . Smoking status: Never Smoker  . Smokeless tobacco: Never Used  Substance and Sexual Activity  . Alcohol use: No    Alcohol/week: 0.0 standard drinks  . Drug use: No  .  Sexual activity: Yes  Lifestyle  . Physical activity:    Days per week: Not on file    Minutes per session: Not on file  . Stress: Not on file  Relationships  . Social connections:    Talks on phone: Not on file    Gets together: Not on file    Attends religious service: Not on file    Active member of club or organization: Not on file    Attends meetings of clubs or organizations: Not on file    Relationship status: Not on file  . Intimate partner violence:    Fear of current or ex partner: Not on file    Emotionally abused: Not on file    Physically abused: Not on file    Forced sexual activity: Not on file  Other Topics Concern  . Not on file  Social History Narrative  . Not on file     PHYSICAL EXAM:  VS: BP (!) 140/110 (BP Location: Right Arm, Patient Position: Sitting, Cuff Size: Normal)   Pulse 76   Temp 98.9 F (37.2 C) (Oral)   Ht 5\' 10"  (1.778 m)   Wt 251 lb 12.8 oz (114.2 kg)   SpO2 97%   BMI 36.13 kg/m  Physical Exam Gen: NAD, alert, cooperative with exam,  ENT:  normal lips, normal nasal mucosa, tympanic membranes clear and intact bilaterally, normal oropharynx, no cervical lymphadenopathy Eye: normal EOM, normal conjunctiva and lids CV:  no edema, +2 pedal pulses, regular rate and rhythm, S1-S2   Resp: no accessory muscle use, non-labored, clear to auscultation bilaterally, no crackles or wheezes Skin: no rashes, no areas of induration  Neuro: normal tone, normal sensation to touch Psych:  normal insight, alert and oriented MSK: Normal gait, normal strength       ASSESSMENT & PLAN:   Cough Likely viral in nature.  Looks well on exam. -Counseled on supportive care. -Provided azithromycin to have on hand if no improvement. -Given indications to return.

## 2018-05-05 NOTE — Assessment & Plan Note (Signed)
Likely viral in nature.  Looks well on exam. -Counseled on supportive care. -Provided azithromycin to have on hand if no improvement. -Given indications to return.

## 2018-05-05 NOTE — Patient Instructions (Signed)
Nice to meet you  Please try things such as zyrtec-D or allegra-D which is an antihistamine and decongestant.   Please try afrin which will help with nasal congestion but use for only three days.   Please also try using a netti pot on a regular occasion.  Honey, vick's vapor rub, lozenges, and a humidifier can help with a cough.  Happy Holidays.

## 2019-04-06 ENCOUNTER — Other Ambulatory Visit: Payer: Self-pay

## 2019-04-07 ENCOUNTER — Telehealth: Payer: Self-pay | Admitting: Medical

## 2019-04-07 ENCOUNTER — Other Ambulatory Visit: Payer: Self-pay

## 2019-04-07 ENCOUNTER — Ambulatory Visit (INDEPENDENT_AMBULATORY_CARE_PROVIDER_SITE_OTHER): Payer: Medicaid Other | Admitting: Medical

## 2019-04-07 ENCOUNTER — Encounter: Payer: Self-pay | Admitting: Medical

## 2019-04-07 VITALS — BP 140/88 | HR 76 | Temp 98.0°F | Ht 70.0 in | Wt 251.0 lb

## 2019-04-07 DIAGNOSIS — M545 Low back pain, unspecified: Secondary | ICD-10-CM

## 2019-04-07 DIAGNOSIS — R519 Headache, unspecified: Secondary | ICD-10-CM

## 2019-04-07 DIAGNOSIS — Z0000 Encounter for general adult medical examination without abnormal findings: Secondary | ICD-10-CM

## 2019-04-07 LAB — CBC WITH DIFFERENTIAL/PLATELET
Basophils Absolute: 0 10*3/uL (ref 0.0–0.1)
Basophils Relative: 0.6 % (ref 0.0–3.0)
Eosinophils Absolute: 0.1 10*3/uL (ref 0.0–0.7)
Eosinophils Relative: 2.7 % (ref 0.0–5.0)
HCT: 43.4 % (ref 39.0–52.0)
Hemoglobin: 14.4 g/dL (ref 13.0–17.0)
Lymphocytes Relative: 59 % — ABNORMAL HIGH (ref 12.0–46.0)
Lymphs Abs: 2.2 10*3/uL (ref 0.7–4.0)
MCHC: 33.2 g/dL (ref 30.0–36.0)
MCV: 84.8 fl (ref 78.0–100.0)
Monocytes Absolute: 0.3 10*3/uL (ref 0.1–1.0)
Monocytes Relative: 7.4 % (ref 3.0–12.0)
Neutro Abs: 1.1 10*3/uL — ABNORMAL LOW (ref 1.4–7.7)
Neutrophils Relative %: 30.3 % — ABNORMAL LOW (ref 43.0–77.0)
Platelets: 223 10*3/uL (ref 150.0–400.0)
RBC: 5.11 Mil/uL (ref 4.22–5.81)
RDW: 14.9 % (ref 11.5–15.5)
WBC: 3.7 10*3/uL — ABNORMAL LOW (ref 4.0–10.5)

## 2019-04-07 LAB — COMPREHENSIVE METABOLIC PANEL
ALT: 30 U/L (ref 0–53)
AST: 24 U/L (ref 0–37)
Albumin: 4.4 g/dL (ref 3.5–5.2)
Alkaline Phosphatase: 78 U/L (ref 39–117)
BUN: 11 mg/dL (ref 6–23)
CO2: 30 mEq/L (ref 19–32)
Calcium: 9.7 mg/dL (ref 8.4–10.5)
Chloride: 102 mEq/L (ref 96–112)
Creatinine, Ser: 0.89 mg/dL (ref 0.40–1.50)
GFR: 117.89 mL/min (ref >60.00)
Glucose, Bld: 69 mg/dL — ABNORMAL LOW (ref 70–99)
Potassium: 4.4 mEq/L (ref 3.5–5.1)
Sodium: 138 mEq/L (ref 135–145)
Total Bilirubin: 0.5 mg/dL (ref 0.2–1.2)
Total Protein: 7.1 g/dL (ref 6.0–8.3)

## 2019-04-07 LAB — LIPID PANEL
Cholesterol: 145 mg/dL (ref 0–200)
HDL: 24.6 mg/dL — ABNORMAL LOW (ref >39.00)
NonHDL: 120.69
Total CHOL/HDL Ratio: 6
Triglycerides: 300 mg/dL — ABNORMAL HIGH (ref 0.0–149.0)
VLDL: 60 mg/dL — ABNORMAL HIGH (ref 0.0–40.0)

## 2019-04-07 LAB — LDL CHOLESTEROL, DIRECT: Direct LDL: 71 mg/dL

## 2019-04-07 MED ORDER — SUMATRIPTAN SUCCINATE 50 MG PO TABS: 50.0000 mg | ORAL_TABLET | ORAL | 0 refills | Status: DC | PRN

## 2019-04-07 MED ORDER — DICLOFENAC SODIUM 75 MG PO TBEC: 75.0000 mg | DELAYED_RELEASE_TABLET | Freq: Two times a day (BID) | ORAL | 0 refills | Status: DC

## 2019-04-07 MED ORDER — FENOFIBRATE 48 MG PO TABS: 48.0000 mg | ORAL_TABLET | Freq: Every day | ORAL | 11 refills | Status: DC

## 2019-04-07 MED ORDER — CYCLOBENZAPRINE HCL 10 MG PO TABS: 10.0000 mg | ORAL_TABLET | Freq: Every day | ORAL | 0 refills | Status: DC

## 2019-04-07 NOTE — Progress Notes (Addendum)
Subjective:    Patient ID: Ian Romero, male    DOB: June 27, 1985, 33 y.o.   MRN: [id number]  HPI  Pt in for a physical.  Pt fasting.  I have reviewed pt PMH, PSH, FH, Social History and Surgical History  Pt works TE Surveyor, mining. Pt does exercise 3 times a week, no more sodas on review, Avoids eating out. Married- 3 child.  Pt declines flu vaccine this year.    Review of Systems  Constitutional: Negative for chills, fatigue and fever.  HENT: Negative for congestion, ear discharge, postnasal drip, sinus pressure, sinus pain and sneezing.   Respiratory: Negative for chest tightness, shortness of breath and wheezing.   Cardiovascular: Negative for chest pain and palpitations.  Gastrointestinal: Negative for abdominal pain.  Genitourinary: Negative for dysuria, flank pain and frequency.  Musculoskeletal: Positive for back pain.       Pt has some low back pain since Monday. Just woke up and had pain. No injury or fall. States pain intermittent since Monday. Taking tylenol for pain. No radiating pain. No leg weakness. No incontenice.    Skin: Negative for rash.  Neurological: Positive for headaches.       Pt states about 2 weeks ago he started to get some Gaylyn Rong that occur randomly. States light and sound can worsen ha. If goes in dark room headache will eventually subside.  Back in 2017 rx of imitrex in chart. He remembers that this worked.  Hematological: Negative for adenopathy. Does not bruise/bleed easily.  Psychiatric/Behavioral: Negative for behavioral problems, decreased concentration, dysphoric mood and sleep disturbance.    Past Medical History:  Diagnosis Date  . Allergy   . Chronic headaches      Social History   Socioeconomic History  . Marital status: Married    Spouse name: Not on file  . Number of children: Not on file  . Years of education: Not on file  . Highest education level: Not on file  Occupational History  . Occupation: unemployed  Social Needs   . Financial resource strain: Not on file  . Food insecurity    Worry: Not on file    Inability: Not on file  . Transportation needs    Medical: Not on file    Non-medical: Not on file  Tobacco Use  . Smoking status: Never Smoker  . Smokeless tobacco: Never Used  Substance and Sexual Activity  . Alcohol use: No    Alcohol/week: 0.0 standard drinks  . Drug use: No  . Sexual activity: Yes  Lifestyle  . Physical activity    Days per week: Not on file    Minutes per session: Not on file  . Stress: Not on file  Relationships  . Social Musician on phone: Not on file    Gets together: Not on file    Attends religious service: Not on file    Active member of club or organization: Not on file    Attends meetings of clubs or organizations: Not on file    Relationship status: Not on file  . Intimate partner violence    Fear of current or ex partner: Not on file    Emotionally abused: Not on file    Physically abused: Not on file    Forced sexual activity: Not on file  Other Topics Concern  . Not on file  Social History Narrative  . Not on file    Past Surgical History:  Procedure Laterality Date  .  APPENDECTOMY    . NECK SURGERY  2008/2009    Family History  Problem Relation Age of Onset  . Allergies Mother   . Asthma Paternal Grandfather     No Known Allergies  Current Outpatient Medications on File Prior to Visit  Medication Sig Dispense Refill  . fluticasone (FLONASE) 50 MCG/ACT nasal spray PLACE 2 SPRAYS INTO BOTH NOSTRILS DAILY. 16 g 11  . levocetirizine (XYZAL) 5 MG tablet Take 1 tablet (5 mg total) by mouth every evening. 30 tablet 0  . cyclobenzaprine (FLEXERIL) 10 MG tablet Take 1 tablet (10 mg total) by mouth at bedtime. (Patient not taking: Reported on 05/05/2018) 10 tablet 0  . diclofenac (VOLTAREN) 75 MG EC tablet Take 1 tablet (75 mg total) by mouth 2 (two) times daily. (Patient not taking: Reported on 05/05/2018) 30 tablet 0  . gabapentin  (NEURONTIN) 100 MG capsule TAKE 1 CAPSULE BY MOUTH THREE TIMES A DAY (Patient not taking: Reported on 05/05/2018) 90 capsule 0  . ibuprofen (ADVIL,MOTRIN) 600 MG tablet Take 1 tablet (600 mg total) by mouth every 6 (six) hours as needed. (Patient not taking: Reported on 05/05/2018) 30 tablet 0  . traMADol (ULTRAM) 50 MG tablet Take 1 tablet (50 mg total) by mouth every 6 (six) hours as needed. (Patient not taking: Reported on 05/05/2018) 20 tablet 0   No current facility-administered medications on file prior to visit.     There were no vitals taken for this visit.      Objective:   Physical Exam  General Mental Status- Alert. General Appearance- Not in acute distress.   Skin General: Color- Normal Color. Moisture- Normal Moisture.  Neck Carotid Arteries- Normal color. Moisture- Normal Moisture. No carotid bruits. No JVD.  Chest and Lung Exam Auscultation: Breath Sounds:-Normal.  Cardiovascular Auscultation:Rythm- Regular. Murmurs & Other Heart Sounds:Auscultation of the heart reveals- No Murmurs.  Abdomen Inspection:-Inspeection Normal. Palpation/Percussion:Note:No mass. Palpation and Percussion of the abdomen reveal- Non Tender, Non Distended + BS, no rebound or guarding.   Neurologic Cranial Nerve exam:- CN III-XII intact(No nystagmus), symmetric smile. Drift Test:- No drift. Romberg Exam:- Negative.  Heal to Toe Gait exam:-Normal. Finger to Nose:- Normal/Intact Strength:- 5/5 equal and symmetric strength both upper and lower extremities.  Back- mid lumbar spine tenderness to palpation. Pain on changing positions. Lower ext- -5-s1 sensation intact. Normal reflexes.      Assessment & Plan:  For you wellness exam today I have ordered cbc, cmp andl lipid panel.  Flu vaccine declined.  Recommend exercise and healthy diet.  We will let you know lab results as they come in.  Follow up date appointment will be determined after lab review.   For head migraine  like will rx imitrex again as this helped you in past. If ha does not respond can see you in office. But if after hours and severe then ED eval.  For back pain, recommend stretches/exercise. Rx diclofenac and flexeril Rx advisemement. If pain persists into next week get xrays.  [place] charge as well. Addressed back pain and ha's.  Mackie Pai, PA-C

## 2019-04-07 NOTE — Addendum Note (Signed)
Addended by: Anabel Halon on: 04/07/2019 02:12 PM   Modules accepted: Orders

## 2019-04-07 NOTE — Telephone Encounter (Signed)
RX fenofibrate sent to pt pharmacy.

## 2019-04-07 NOTE — Patient Instructions (Addendum)
For you wellness exam today I have ordered cbc, cmp andl lipid panel.  Flu vaccine declined.  Recommend exercise and healthy diet.  We will let you know lab results as they come in.  Follow up date appointment will be determined after lab review.   For head migraine like will rx imitrex again as this helped you in past. If ha does not respond can see you in office. But if after hours and severe then ED eval.  For back pain, recommend stretches/exercise. Rx diclofenac and flexeril Rx advisemement. If pain persists into next week get xrays.   Preventive Care 21-39 Years Old, Male Preventive care refers to lifestyle choices and visits with your health care provider that can promote health and wellness. This includes:  A yearly physical exam. This is also called an annual well check.  Regular dental and eye exams.  Immunizations.  Screening for certain conditions.  Healthy lifestyle choices, such as eating a healthy diet, getting regular exercise, not using drugs or products that contain nicotine and tobacco, and limiting alcohol use. What can I expect for my preventive care visit? Physical exam Your health care provider will check:  Height and weight. These may be used to calculate body mass index (BMI), which is a measurement that tells if you are at a healthy weight.  Heart rate and blood pressure.  Your skin for abnormal spots. Counseling Your health care provider may ask you questions about:  Alcohol, tobacco, and drug use.  Emotional well-being.  Home and relationship well-being.  Sexual activity.  Eating habits.  Work and work environment. What immunizations do I need?  Influenza (flu) vaccine  This is recommended every year. Tetanus, diphtheria, and pertussis (Tdap) vaccine  You may need a Td booster every 10 years. Varicella (chickenpox) vaccine  You may need this vaccine if you have not already been vaccinated. Human papillomavirus (HPV)  vaccine  If recommended by your health care provider, you may need three doses over 6 months. Measles, mumps, and rubella (MMR) vaccine  You may need at least one dose of MMR. You may also need a second dose. Meningococcal conjugate (MenACWY) vaccine  One dose is recommended if you are 19-21 years old and a first-year college student living in a residence hall, or if you have one of several medical conditions. You may also need additional booster doses. Pneumococcal conjugate (PCV13) vaccine  You may need this if you have certain conditions and were not previously vaccinated. Pneumococcal polysaccharide (PPSV23) vaccine  You may need one or two doses if you smoke cigarettes or if you have certain conditions. Hepatitis A vaccine  You may need this if you have certain conditions or if you travel or work in places where you may be exposed to hepatitis A. Hepatitis B vaccine  You may need this if you have certain conditions or if you travel or work in places where you may be exposed to hepatitis B. Haemophilus influenzae type b (Hib) vaccine  You may need this if you have certain risk factors. You may receive vaccines as individual doses or as more than one vaccine together in one shot (combination vaccines). Talk with your health care provider about the risks and benefits of combination vaccines. What tests do I need? Blood tests  Lipid and cholesterol levels. These may be checked every 5 years starting at age 20.  Hepatitis C test.  Hepatitis B test. Screening   Diabetes screening. This is done by checking your blood sugar (glucose)   after you have not eaten for a while (fasting).  Sexually transmitted disease (STD) testing. Talk with your health care provider about your test results, treatment options, and if necessary, the need for more tests. Follow these instructions at home: Eating and drinking   Eat a diet that includes fresh fruits and vegetables, whole grains, lean  protein, and low-fat dairy products.  Take vitamin and mineral supplements as recommended by your health care provider.  Do not drink alcohol if your health care provider tells you not to drink.  If you drink alcohol: ? Limit how much you have to 0-2 drinks a day. ? Be aware of how much alcohol is in your drink. In the [place] one drink equals one 12 oz bottle of beer (355 mL), one 5 oz glass of wine (148 mL), or one 1 oz glass of hard liquor (44 mL). Lifestyle  Take daily care of your teeth and gums.  Stay active. Exercise for at least 30 minutes on 5 or more days each week.  Do not use any products that contain nicotine or tobacco, such as cigarettes, e-cigarettes, and chewing tobacco. If you need help quitting, ask your health care provider.  If you are sexually active, practice safe sex. Use a condom or other form of protection to prevent STIs (sexually transmitted infections). What's next?  Go to your health care provider once a year for a well check visit.  Ask your health care provider how often you should have your eyes and teeth checked.  Stay up to date on all vaccines. This information is not intended to replace advice given to you by your health care provider. Make sure you discuss any questions you have with your health care provider. Document Released: 06/26/2001 Document Revised: 04/24/2018 Document Reviewed: 04/24/2018 Elsevier Patient Education  2020 [employer].    Back Exercises These exercises help to make your trunk and back strong. They also help to keep the lower back flexible. Doing these exercises can help to prevent back pain or lessen existing pain.  If you have back pain, try to do these exercises 2-3 times each day or as told by your doctor.  As you get better, do the exercises once each day. Repeat the exercises more often as told by your doctor.  To stop back pain from coming back, do the exercises once each day, or as told by your  doctor. Exercises Single knee to chest Do these steps 3-5 times in a row for each leg: 1. Lie on your back on a firm bed or the floor with your legs stretched out. 2. Bring one knee to your chest. 3. Grab your knee or thigh with both hands and hold them it in place. 4. Pull on your knee until you feel a gentle stretch in your lower back or buttocks. 5. Keep doing the stretch for 10-30 seconds. 6. Slowly let go of your leg and straighten it. Pelvic tilt Do these steps 5-10 times in a row: 1. Lie on your back on a firm bed or the floor with your legs stretched out. 2. Bend your knees so they point up to the ceiling. Your feet should be flat on the floor. 3. Tighten your lower belly (abdomen) muscles to press your lower back against the floor. This will make your tailbone point up to the ceiling instead of pointing down to your feet or the floor. 4. Stay in this position for 5-10 seconds while you gently tighten your muscles and breathe  evenly. Cat-cow Do these steps until your lower back bends more easily: 1. Get on your hands and knees on a firm surface. Keep your hands under your shoulders, and keep your knees under your hips. You may put padding under your knees. 2. Let your head hang down toward your chest. Tighten (contract) the muscles in your belly. Point your tailbone toward the floor so your lower back becomes rounded like the back of a cat. 3. Stay in this position for 5 seconds. 4. Slowly lift your head. Let the muscles of your belly relax. Point your tailbone up toward the ceiling so your back forms a sagging arch like the back of a cow. 5. Stay in this position for 5 seconds.  Press-ups Do these steps 5-10 times in a row: 1. Lie on your belly (face-down) on the floor. 2. Place your hands near your head, about shoulder-width apart. 3. While you keep your back relaxed and keep your hips on the floor, slowly straighten your arms to raise the top half of your body and lift your  shoulders. Do not use your back muscles. You may change where you place your hands in order to make yourself more comfortable. 4. Stay in this position for 5 seconds. 5. Slowly return to lying flat on the floor.  Bridges Do these steps 10 times in a row: 1. Lie on your back on a firm surface. 2. Bend your knees so they point up to the ceiling. Your feet should be flat on the floor. Your arms should be flat at your sides, next to your body. 3. Tighten your butt muscles and lift your butt off the floor until your waist is almost as high as your knees. If you do not feel the muscles working in your butt and the back of your thighs, slide your feet 1-2 inches farther away from your butt. 4. Stay in this position for 3-5 seconds. 5. Slowly lower your butt to the floor, and let your butt muscles relax. If this exercise is too easy, try doing it with your arms crossed over your chest. Belly crunches Do these steps 5-10 times in a row: 1. Lie on your back on a firm bed or the floor with your legs stretched out. 2. Bend your knees so they point up to the ceiling. Your feet should be flat on the floor. 3. Cross your arms over your chest. 4. Tip your chin a little bit toward your chest but do not bend your neck. 5. Tighten your belly muscles and slowly raise your chest just enough to lift your shoulder blades a tiny bit off of the floor. Avoid raising your body higher than that, because it can put too much stress on your low back. 6. Slowly lower your chest and your head to the floor. Back lifts Do these steps 5-10 times in a row: 1. Lie on your belly (face-down) with your arms at your sides, and rest your forehead on the floor. 2. Tighten the muscles in your legs and your butt. 3. Slowly lift your chest off of the floor while you keep your hips on the floor. Keep the back of your head in line with the curve in your back. Look at the floor while you do this. 4. Stay in this position for 3-5  seconds. 5. Slowly lower your chest and your face to the floor. Contact a doctor if:  Your back pain gets a lot worse when you do an exercise.  Your back   pain does not get better 2 hours after you exercise. If you have any of these problems, stop doing the exercises. Do not do them again unless your doctor says it is okay. Get help right away if:  You have sudden, very bad back pain. If this happens, stop doing the exercises. Do not do them again unless your doctor says it is okay. This information is not intended to replace advice given to you by your health care provider. Make sure you discuss any questions you have with your health care provider. Document Released: 06/02/2010 Document Revised: 01/23/2018 Document Reviewed: 01/23/2018 Elsevier Patient Education  2020 [employer]       

## 2019-05-28 ENCOUNTER — Encounter (HOSPITAL_BASED_OUTPATIENT_CLINIC_OR_DEPARTMENT_OTHER): Payer: Self-pay

## 2019-05-28 ENCOUNTER — Emergency Department (HOSPITAL_BASED_OUTPATIENT_CLINIC_OR_DEPARTMENT_OTHER)
Admission: EM | Admit: 2019-05-28 | Discharge: 2019-05-28 | Disposition: A | Discharge status: Home / Self Care | Payer: Self-pay | Attending: Emergency Medicine | Admitting: Emergency Medicine

## 2019-05-28 ENCOUNTER — Other Ambulatory Visit: Payer: Self-pay

## 2019-05-28 ENCOUNTER — Emergency Department (HOSPITAL_BASED_OUTPATIENT_CLINIC_OR_DEPARTMENT_OTHER): Payer: Self-pay

## 2019-05-28 DIAGNOSIS — W19XXXA Unspecified fall, initial encounter: Secondary | ICD-10-CM

## 2019-05-28 DIAGNOSIS — Y939 Activity, unspecified: Secondary | ICD-10-CM | POA: Insufficient documentation

## 2019-05-28 DIAGNOSIS — Y929 Unspecified place or not applicable: Secondary | ICD-10-CM | POA: Insufficient documentation

## 2019-05-28 DIAGNOSIS — M25512 Pain in left shoulder: Secondary | ICD-10-CM | POA: Insufficient documentation

## 2019-05-28 DIAGNOSIS — M25552 Pain in left hip: Secondary | ICD-10-CM | POA: Insufficient documentation

## 2019-05-28 DIAGNOSIS — R208 Other disturbances of skin sensation: Secondary | ICD-10-CM | POA: Insufficient documentation

## 2019-05-28 DIAGNOSIS — Y999 Unspecified external cause status: Secondary | ICD-10-CM | POA: Insufficient documentation

## 2019-05-28 DIAGNOSIS — M62838 Other muscle spasm: Secondary | ICD-10-CM | POA: Insufficient documentation

## 2019-05-28 DIAGNOSIS — Z79899 Other long term (current) drug therapy: Secondary | ICD-10-CM | POA: Insufficient documentation

## 2019-05-28 DIAGNOSIS — W000XXA Fall on same level due to ice and snow, initial encounter: Secondary | ICD-10-CM | POA: Insufficient documentation

## 2019-05-28 MED ORDER — PREDNISONE 10 MG PO TABS: 40.0000 mg | ORAL_TABLET | Freq: Every day | ORAL | 0 refills | Status: AC

## 2019-05-28 MED ORDER — CYCLOBENZAPRINE HCL 5 MG PO TABS
5.0000 mg | ORAL_TABLET | Freq: Once | ORAL | Status: AC
  Administered 2019-05-28: 5 mg via ORAL
  Filled 2019-05-28: qty 1

## 2019-05-28 MED ORDER — PREDNISONE 50 MG PO TABS
60.0000 mg | ORAL_TABLET | Freq: Once | ORAL | Status: AC
  Administered 2019-05-28: 60 mg via ORAL
  Filled 2019-05-28: qty 1

## 2019-05-28 MED ORDER — CYCLOBENZAPRINE HCL 10 MG PO TABS: 10.0000 mg | ORAL_TABLET | Freq: Two times a day (BID) | ORAL | 0 refills | Status: AC | PRN

## 2019-05-28 NOTE — Discharge Instructions (Signed)
Apply heating pad to the area for 20 minutes at a time.  You may alternate this with applying ice.  Take medication as prescribed. Thank you for allowing me to care for you today. Please return to the emergency department if you have new or worsening symptoms. Take your medications as instructed.

## 2019-05-28 NOTE — ED Provider Notes (Signed)
[facility] EMERGENCY DEPARTMENT Provider Note   CSN: [id number] Arrival date & time: 05/28/19  1136     History Chief Complaint  Patient presents with  . Fall    Ian Romero is a 34 y.o. male.  Patient is a 34 year old gentleman with past medical history of previous neck surgery presenting to the emergency department for evaluation after fall.  Patient reports that on the ninth of this month he slipped on some icy ground and fell backwards and landed on his left posterior shoulder and hip.  Reports that since then he has had pain in his left posterior shoulder which radiates down into his arm causing some heaviness and weakness in his arm.  Denies any neck pain.  Reports that the hip pain is mild and he is ambulatory without any issues.  He is mostly concerned about his left arm pain and that he has had previous surgery on his neck before.  He wonders if he has a pinched nerve.        Past Medical History:  Diagnosis Date  . Allergy   . Chronic headaches     Patient Active Problem List   Diagnosis Date Noted  . Cough 05/05/2018  . Left shoulder pain 05/01/2017  . OSA (obstructive sleep apnea) 11/14/2015  . Wellness examination 08/24/2015    Past Surgical History:  Procedure Laterality Date  . APPENDECTOMY    . NECK SURGERY  2008/2009       Family History  Problem Relation Age of Onset  . Allergies Mother   . Asthma Paternal Grandfather     Social History   Tobacco Use  . Smoking status: Never Smoker  . Smokeless tobacco: Never Used  Substance Use Topics  . Alcohol use: No    Alcohol/week: 0.0 standard drinks  . Drug use: No    Home Medications Prior to Admission medications   Medication Sig Start Date End Date Taking? Authorizing Provider  cyclobenzaprine (FLEXERIL) 10 MG tablet Take 1 tablet (10 mg total) by mouth 2 (two) times daily as needed for up to 7 days for muscle spasms. 05/28/19 06/04/19  Arlyn Dunning, PA-C  diclofenac  (VOLTAREN) 75 MG EC tablet Take 1 tablet (75 mg total) by mouth 2 (two) times daily. 04/07/19   Saguier, Ramon Dredge, PA-C  fenofibrate (TRICOR) 48 MG tablet Take 1 tablet (48 mg total) by mouth daily. 04/07/19   Saguier, Ramon Dredge, PA-C  fluticasone (FLONASE) 50 MCG/ACT nasal spray PLACE 2 SPRAYS INTO BOTH NOSTRILS DAILY. 08/28/17   Saguier, Ramon Dredge, PA-C  gabapentin (NEURONTIN) 100 MG capsule TAKE 1 CAPSULE BY MOUTH THREE TIMES A DAY Patient not taking: Reported on 05/05/2018 08/29/17   Saguier, Ramon Dredge, PA-C  ibuprofen (ADVIL,MOTRIN) 600 MG tablet Take 1 tablet (600 mg total) by mouth every 6 (six) hours as needed. Patient not taking: Reported on 05/05/2018 08/27/15   Loren Racer, MD  levocetirizine (XYZAL) 5 MG tablet Take 1 tablet (5 mg total) by mouth every evening. 02/13/18   Saguier, Ramon Dredge, PA-C  predniSONE (DELTASONE) 10 MG tablet Take 4 tablets (40 mg total) by mouth daily for 5 days. 05/28/19 06/02/19  Arlyn Dunning, PA-C  SUMAtriptan (IMITREX) 50 MG tablet Take 1 tablet (50 mg total) by mouth every 2 (two) hours as needed for migraine. May repeat in 2 hours if headache persists or recurs. 04/07/19   Saguier, Ramon Dredge, PA-C  traMADol (ULTRAM) 50 MG tablet Take 1 tablet (50 mg total) by mouth every 6 (six) hours as  needed. Patient not taking: Reported on 05/05/2018 07/10/17   Saguier, Percell Miller, PA-C    Allergies    Patient has no known allergies.  Review of Systems   Review of Systems  Constitutional: Negative for chills and fever.  Respiratory: Negative for cough.   Cardiovascular: Negative for chest pain.  Gastrointestinal: Negative for abdominal pain.  Genitourinary: Negative for dysuria.  Musculoskeletal: Positive for arthralgias, back pain and myalgias. Negative for gait problem, joint swelling, neck pain and neck stiffness.  Skin: Negative for rash and wound.  Neurological: Positive for weakness. Negative for seizures, syncope, light-headedness and headaches.    Physical Exam Updated  Vital Signs BP (!) 148/86 (BP Location: Right Arm)   Pulse 95   Temp 98.6 F (37 C) (Oral)   Resp 16   Ht 5\' 10"  (1.778 m)   Wt 113.4 kg   SpO2 99%   BMI 35.87 kg/m   Physical Exam Vitals and nursing note reviewed.  Constitutional:      General: He is not in acute distress.    Appearance: Normal appearance. He is obese. He is not ill-appearing, toxic-appearing or diaphoretic.  HENT:     Head: Normocephalic.  Eyes:     Conjunctiva/sclera: Conjunctivae normal.  Cardiovascular:     Rate and Rhythm: Normal rate and regular rhythm.  Pulmonary:     Effort: Pulmonary effort is normal.  Musculoskeletal:     Comments: Tenderness to the left posterior trapezius.  Pain is reproduced with abduction of the left arm.  Grip strength is equal bilaterally with normal distal pulses.  No tenderness to palpation of the neck.  No bony tenderness of the neck or arm.  Skin:    General: Skin is warm and dry.  Neurological:     Mental Status: He is alert.     Deep Tendon Reflexes: Reflexes normal.     Comments: Decreased sensation to light touch at the left distal forearm  Psychiatric:        Mood and Affect: Mood normal.     ED Results / Procedures / Treatments   Labs (all labs ordered are listed, but only abnormal results are displayed) Labs Reviewed - No data to display  EKG None  Radiology DG Cervical Spine Complete  Result Date: 05/28/2019 CLINICAL DATA:  Pain fall going fall EXAM: CERVICAL SPINE - COMPLETE 4+ VIEW COMPARISON:  July 10, 2017 FINDINGS: Frontal, lateral, open-mouth odontoid, and bilateral oblique views were obtained. Patient is status post anterior screw and plate fixation at C3 and C4 with support hardware intact. There is no fracture or spondylolisthesis. Prevertebral soft tissues and predental space regions are normal. There is moderate disc space narrowing at C5-6. There is slight disc space narrowing at C4-5 and C6-7. There is facet hypertrophy with exit foraminal  narrowing at C4-5, C5-6, and C6-7 bilaterally. There is mild reversal of lordotic curvature. Lung apices are clear.  There is upper thoracic dextroscoliosis. IMPRESSION: Postoperative change at C3 and C4 with support hardware intact. No acute fracture or spondylolisthesis. 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 Reversal of lordotic curvature may be indicative of a degree of muscle spasm. Upper thoracic dextroscoliosis noted. Electronically Signed   By: Lowella Grip III M.D.   On: 05/28/2019 13:15   DG Shoulder Left  Result Date: 05/28/2019 CLINICAL DATA:  Pain following fall EXAM: LEFT SHOULDER - 2+ VIEW COMPARISON:  July 10, 2017 left scapular study; left shoulder radiographs April 11, 2017 FINDINGS: Oblique, Y scapular, and axillary images  obtained. No fracture or dislocation. Joint spaces appear normal. No erosive change. Visualized left lung clear. IMPRESSION: No fracture or dislocation.  No evident arthropathy. Electronically Signed   By: Bretta Bang III M.D.   On: 05/28/2019 13:13    Procedures Procedures (including critical care time)  Medications Ordered in ED Medications  predniSONE (DELTASONE) tablet 60 mg (60 mg Oral Given 05/28/19 1308)  cyclobenzaprine (FLEXERIL) tablet 5 mg (5 mg Oral Given 05/28/19 1308)    ED Course  I have reviewed the triage vital signs and the nursing notes.  Pertinent labs & imaging results that were available during my care of the patient were reviewed by me and considered in my medical decision making (see chart for details).  Clinical Course as of May 28 1327  Thu May 28, 2019  1315 Patient here for evaluation of left arm pain after fall.  He does have previous surgery on his neck with hardware in place.  Will obtain an x-ray of the neck and the shoulder.  He does have radicular symptoms.  Possibly irritation of the nerve.  Will give prednisone and cyclobenzaprine.   [KM]  1327 X-ray shows sign of spasm in the muscles but no  bony injury.  Will send home with prednisone and cyclobenzaprine.  Advised to follow-up with primary care doctor.   [KM]    Clinical Course User Index [KM] Jeral Pinch   MDM Rules/Calculators/A&P                      Based on review of vitals, medical screening exam, lab work and/or imaging, there does not appear to be an acute, emergent etiology for the patient's symptoms. Counseled pt on good return precautions and encouraged both PCP and ED follow-up as needed.  Prior to discharge, I also discussed incidental imaging findings with patient in detail and advised appropriate, recommended follow-up in detail.  Clinical Impression: 1. Fall, initial encounter   2. Muscle spasm     Disposition: Discharge  Prior to providing a prescription for a controlled substance, I independently reviewed the patient's recent prescription history on the [place] Controlled Substance Reporting System. The patient had no recent or regular prescriptions and was deemed appropriate for a brief, less than 3 day prescription of narcotic for acute analgesia.  This note was prepared with assistance of Conservation officer, historic buildings. Occasional wrong-word or sound-a-like substitutions may have occurred due to the inherent limitations of voice recognition software.  Final Clinical Impression(s) / ED Diagnoses Final diagnoses:  Fall, initial encounter  Muscle spasm    Rx / DC Orders ED Discharge Orders         Ordered    predniSONE (DELTASONE) 10 MG tablet  Daily     05/28/19 1329    cyclobenzaprine (FLEXERIL) 10 MG tablet  2 times daily PRN     05/28/19 1329           Jeral Pinch 05/28/19 1329    Geoffery Lyons, MD 05/28/19 1341

## 2019-05-28 NOTE — ED Triage Notes (Signed)
Pt states he slipped/fell on 1/9-pain to left UE-NAD-steady gait

## 2019-05-28 NOTE — ED Notes (Signed)
Slipped and fell 5-6 days ago  C/o pain to left shoulder radiating down arm

## 2020-03-17 DIAGNOSIS — G8929 Other chronic pain: Secondary | ICD-10-CM | POA: Insufficient documentation

## 2021-01-09 ENCOUNTER — Ambulatory Visit: Payer: Medicaid Other | Admitting: Family Medicine

## 2021-01-25 ENCOUNTER — Ambulatory Visit: Payer: BC Managed Care – PPO | Admitting: Family Medicine

## 2021-01-25 ENCOUNTER — Encounter: Payer: Self-pay | Admitting: Family Medicine

## 2021-01-25 ENCOUNTER — Other Ambulatory Visit: Payer: Self-pay

## 2021-01-25 VITALS — BP 138/84 | HR 69 | Temp 98.4°F | Ht 70.0 in | Wt 260.0 lb

## 2021-01-25 DIAGNOSIS — M4722 Other spondylosis with radiculopathy, cervical region: Secondary | ICD-10-CM | POA: Insufficient documentation

## 2021-01-25 MED ORDER — TIZANIDINE HCL 4 MG PO TABS: 4.0000 mg | ORAL_TABLET | Freq: Every day | ORAL | 0 refills | Status: DC

## 2021-01-25 MED ORDER — CELECOXIB 200 MG PO CAPS: 200.0000 mg | ORAL_CAPSULE | Freq: Two times a day (BID) | ORAL | 0 refills | Status: DC

## 2021-01-25 MED ORDER — PREDNISONE 50 MG PO TABS: 50.0000 mg | ORAL_TABLET | Freq: Every day | ORAL | 0 refills | Status: DC

## 2021-01-25 NOTE — Progress Notes (Signed)
Primary Care / Sports Medicine Office Visit  Patient Information:  Patient ID: Ian Romero, male DOB: Oct 20, 1985 Age: 35 y.o. MRN: [id number]   Ian Romero is a pleasant 35 y.o. male presenting with the following:  Chief Complaint  Patient presents with   New Patient (Initial Visit)   Establish Care    Moved from [facility] 12/12/20   Neck Pain    X2 months; 2007 C-spine fusion; was seeing chiropractor 3 times weekly for readjustment and decompression; radiates into right arm and hand, sharp pain, numbness and tingling associated; worse at night; history of taking diclofenac, cyclobenzaprine, gabapentin, ibuprofen, Tylenol and tramadol with no relief; right-handedness; 9/10 pain    Review of Systems pertinent details above   Patient Active Problem List   Diagnosis Date Noted   Cervical spondylosis with radiculopathy 01/25/2021   Cough 05/05/2018   Left shoulder pain 05/01/2017   OSA (obstructive sleep apnea) 11/14/2015   Wellness examination 08/24/2015   Past Medical History:  Diagnosis Date   Allergy    Chronic headaches    Outpatient Encounter Medications as of 01/25/2021  Medication Sig   celecoxib (CELEBREX) 200 MG capsule Take 1 capsule (200 mg total) by mouth 2 (two) times daily. One to 2 tablets by mouth daily as needed for pain.   predniSONE (DELTASONE) 50 MG tablet Take 1 tablet (50 mg total) by mouth daily.   tiZANidine (ZANAFLEX) 4 MG tablet Take 1 tablet (4 mg total) by mouth at bedtime.   fenofibrate (TRICOR) 48 MG tablet Take 1 tablet (48 mg total) by mouth daily. (Patient not taking: Reported on 01/25/2021)   traMADol (ULTRAM) 50 MG tablet Take 1 tablet (50 mg total) by mouth every 6 (six) hours as needed. (Patient not taking: No sig reported)   [DISCONTINUED] diclofenac (VOLTAREN) 75 MG EC tablet Take 1 tablet (75 mg total) by mouth 2 (two) times daily.   [DISCONTINUED] fluticasone (FLONASE) 50 MCG/ACT nasal spray PLACE 2 SPRAYS INTO BOTH NOSTRILS DAILY.    [DISCONTINUED] gabapentin (NEURONTIN) 100 MG capsule TAKE 1 CAPSULE BY MOUTH THREE TIMES A DAY (Patient not taking: Reported on 05/05/2018)   [DISCONTINUED] ibuprofen (ADVIL,MOTRIN) 600 MG tablet Take 1 tablet (600 mg total) by mouth every 6 (six) hours as needed. (Patient not taking: Reported on 05/05/2018)   [DISCONTINUED] levocetirizine (XYZAL) 5 MG tablet Take 1 tablet (5 mg total) by mouth every evening.   [DISCONTINUED] SUMAtriptan (IMITREX) 50 MG tablet Take 1 tablet (50 mg total) by mouth every 2 (two) hours as needed for migraine. May repeat in 2 hours if headache persists or recurs.   No facility-administered encounter medications on file as of 01/25/2021.   Past Surgical History:  Procedure Laterality Date   APPENDECTOMY     NECK SURGERY  2008/2009   C3-4 per patient    Vitals:   01/25/21 1054  BP: 138/84  Pulse: 69  Temp: 98.4 F (36.9 C)  SpO2: 97%   Vitals:   01/25/21 1054  Weight: 260 lb (117.9 kg)  Height: 5\' 10"  (1.778 m)   Body mass index is 37.31 kg/m.  No results found.   Independent interpretation of notes and tests performed by another provider:   None  Procedures performed:   None  Pertinent History, Exam, Impression, and Recommendations:   Cervical spondylosis with radiculopathy Patient with acute on chronic cervical paraspinal pain with paresthesias in the right shoulder, he does have a history of stated cervical fusion at C3-4.  He has been  re[place] chiropractic care outside of the state and has recently moved here.  Denies any specific trauma or relative overuse at onset, attributes this to cessation of chiropractic care.  Denies any weakness in the extremities but has noted mild paresthesias in the right first digit, no clumsiness.  Physical examination shows limited range of motion with rightward torsion, right lateral tilt, and significant discomfort with flexion compared to contralateral motions.  He has preserved strength in the upper  extremities and sensation appears diminished in the C5-C6 on the right when compared to contralateral.  Negative Hoffmann sign bilaterally.  Spurling's negative bilaterally.  Patient with known history of cervical pathology status post fusion with evidence of intermittent radiculopathy, that being said his examination is relatively reassuring.  I have advised scheduled Celebrex, tizanidine, and low threshold to initiate prednisone and 2 weeks of lack of progress noted.  Return for close follow-up in 4 weeks for reevaluation.  Once symptoms appropriately controlled, he would benefit from formal versus home-based rehab.  For suboptimal progress advanced imaging and referral to pain and spine versus neurosurgery to be considered.  We will attempt to obtain his previous imaging records.   Orders & Medications Meds ordered this encounter  Medications   celecoxib (CELEBREX) 200 MG capsule    Sig: Take 1 capsule (200 mg total) by mouth 2 (two) times daily. One to 2 tablets by mouth daily as needed for pain.    Dispense:  60 capsule    Refill:  0   predniSONE (DELTASONE) 50 MG tablet    Sig: Take 1 tablet (50 mg total) by mouth daily.    Dispense:  5 tablet    Refill:  0   tiZANidine (ZANAFLEX) 4 MG tablet    Sig: Take 1 tablet (4 mg total) by mouth at bedtime.    Dispense:  30 tablet    Refill:  0   No orders of the defined types were placed in this encounter.    Return in about 4 weeks (around 02/22/2021) for annual physical.     Jerrol Banana, MD   Primary Care Sports Medicine [facility]

## 2021-01-25 NOTE — Assessment & Plan Note (Signed)
Patient with acute on chronic cervical paraspinal pain with paresthesias in the right shoulder, he does have a history of stated cervical fusion at C3-4.  He has been [place] chiropractic care outside of the state and has recently moved here.  Denies any specific trauma or relative overuse at onset, attributes this to cessation of chiropractic care.  Denies any weakness in the extremities but has noted mild paresthesias in the right first digit, no clumsiness.  Physical examination shows limited range of motion with rightward torsion, right lateral tilt, and significant discomfort with flexion compared to contralateral motions.  He has preserved strength in the upper extremities and sensation appears diminished in the C5-C6 on the right when compared to contralateral.  Negative Hoffmann sign bilaterally.  Spurling's negative bilaterally.  Patient with known history of cervical pathology status post fusion with evidence of intermittent radiculopathy, that being said his examination is relatively reassuring.  I have advised scheduled Celebrex, tizanidine, and low threshold to initiate prednisone and 2 weeks of lack of progress noted.  Return for close follow-up in 4 weeks for reevaluation.  Once symptoms appropriately controlled, he would benefit from formal versus home-based rehab.  For suboptimal progress advanced imaging and referral to pain and spine versus neurosurgery to be considered.  We will attempt to obtain his previous imaging records.

## 2021-01-25 NOTE — Patient Instructions (Signed)
-   Start Celebrex (celecoxib), dose every a.m. and every p.m. with food (no other NSAIDs while on this medication) - Dose nightly tizanidine (muscle relaxer), side effect and be drowsiness - Perform gentle range of motion exercises at the neck, do not push through pain - If symptoms fail to show any improvement in 2 weeks, start prednisone and take for full course (hold Celebrex while on this medication) - Can restart Celebrex after prednisone complete if dosing - Can utilize moist heat and ice if beneficial for additional pain control - Try to obtain outside records for x-ray, etc. - Return for follow-up in 4 weeks, contact us for questions between now and then

## 2021-01-30 ENCOUNTER — Ambulatory Visit: Payer: BC Managed Care – PPO | Admitting: Family Medicine

## 2021-02-21 ENCOUNTER — Other Ambulatory Visit: Payer: Self-pay | Admitting: Family Medicine

## 2021-02-21 DIAGNOSIS — M4722 Other spondylosis with radiculopathy, cervical region: Secondary | ICD-10-CM

## 2021-02-21 NOTE — Telephone Encounter (Signed)
Requested medications are due for refill today yes  Requested medications are on the active medication list yes  Last refill 01/25/21  Last visit 01/25/21  Future visit scheduled 02/22/21  Notes to clinic Zanaflex is not delegated, Celebrex labwork is greater than 360 days ago, appt this week 02/22/21, please assess.

## 2021-02-22 ENCOUNTER — Encounter: Payer: Self-pay | Admitting: Family Medicine

## 2021-02-22 ENCOUNTER — Other Ambulatory Visit: Payer: Self-pay | Admitting: Family Medicine

## 2021-02-22 ENCOUNTER — Ambulatory Visit (INDEPENDENT_AMBULATORY_CARE_PROVIDER_SITE_OTHER): Payer: BC Managed Care – PPO | Admitting: Family Medicine

## 2021-02-22 ENCOUNTER — Other Ambulatory Visit: Payer: Self-pay

## 2021-02-22 VITALS — BP 138/102 | HR 74 | Temp 98.5°F | Ht 70.0 in | Wt 260.0 lb

## 2021-02-22 DIAGNOSIS — Z0000 Encounter for general adult medical examination without abnormal findings: Secondary | ICD-10-CM

## 2021-02-22 DIAGNOSIS — M4722 Other spondylosis with radiculopathy, cervical region: Secondary | ICD-10-CM

## 2021-02-22 DIAGNOSIS — R7989 Other specified abnormal findings of blood chemistry: Secondary | ICD-10-CM

## 2021-02-22 DIAGNOSIS — Z13220 Encounter for screening for lipoid disorders: Secondary | ICD-10-CM

## 2021-02-22 DIAGNOSIS — G4733 Obstructive sleep apnea (adult) (pediatric): Secondary | ICD-10-CM

## 2021-02-22 DIAGNOSIS — R030 Elevated blood-pressure reading, without diagnosis of hypertension: Secondary | ICD-10-CM

## 2021-02-22 NOTE — Patient Instructions (Signed)
-   Can continue Celebrex and tizanidine on as-needed basis - Start physical therapy, they will provide home exercises to perform daily - Obtain fasting labs with orders provided (can have water or black coffee but otherwise no food or drink x 8 hours before labs) - Review information provided - Attend eye doctor annually, dentist every 6 months, work towards or maintain 30 minutes of moderate intensity physical activity at least 5 days per week, and consume a balanced diet - Return in 4 weeks for blood pressure recheck and to follow-up on your neck - Contact us for any questions between now and then

## 2021-02-22 NOTE — Assessment & Plan Note (Signed)
Documented to have CPAP, will reevaluate at follow-up

## 2021-02-22 NOTE — Assessment & Plan Note (Signed)
Persistently elevated blood pressure readings on serial visits, he is asymptomatic, risk stratification labs ordered.  He will return in 4 weeks for reevaluation, pending labs and blood pressure readings at that return, pharmacotherapy most likely to be initiated.

## 2021-02-22 NOTE — Progress Notes (Signed)
Annual Physical Exam Visit  Patient Information:  Patient ID: Ian Romero, male DOB: [birth year] Age: 35 y.o. MRN: [id number]   Subjective:   CC: Annual Physical Exam  HPI:  Ian Romero is here for their annual physical.  I reviewed the past medical history, family history, social history, surgical history, and allergies today and changes were made as necessary.  Please see the problem list section below for additional details.  Past Medical History: Past Medical History:  Diagnosis Date   Allergy    Anxiety    Chronic headaches    Depression    GERD (gastroesophageal reflux disease)    Hyperlipidemia    Past Surgical History: Past Surgical History:  Procedure Laterality Date   APPENDECTOMY     NECK SURGERY  2008/2009   C3-4 per patient   Family History: Family History  Problem Relation Age of Onset   Allergies Mother    Allergies Daughter    Allergies Son    Allergies Son    Hypertension Maternal Grandmother    Diabetes Maternal Grandmother    Diabetes Paternal Grandmother    Asthma Paternal Grandfather    Stroke Paternal Grandfather    Allergies: No Known Allergies Health Maintenance: Health Maintenance  Topic Date Due   Hepatitis C Screening  Never done   COVID-19 Vaccine (3 - Booster for Pfizer series) 07/05/2020   INFLUENZA VACCINE  08/11/2021 (Originally 12/12/2020)   TETANUS/TDAP  08/23/2025   HIV Screening  Completed   HPV VACCINES  Aged Out    HM Colonoscopy     This patient has no relevant Health Maintenance data.      Medications: Current Outpatient Medications on File Prior to Visit  Medication Sig Dispense Refill   celecoxib (CELEBREX) 200 MG capsule Take 1 capsule (200 mg total) by mouth 2 (two) times daily. One to 2 tablets by mouth daily as needed for pain. 60 capsule 0   levocetirizine (XYZAL) 5 MG tablet Take 5 mg by mouth every evening.     tiZANidine (ZANAFLEX) 4 MG tablet Take 1 tablet (4 mg total) by mouth at bedtime. 30  tablet 0   No current facility-administered medications on file prior to visit.    Review of Systems: No headache, visual changes, nausea, vomiting, diarrhea, constipation, dizziness, abdominal pain, skin rash, fevers, chills, night sweats, swollen lymph nodes, weight loss, chest pain, body aches, joint swelling, muscle aches, shortness of breath, mood changes, visual or auditory hallucinations reported.  Objective:   Vitals:   02/22/21 0848  BP: (!) 138/102  Pulse: 74  Temp: 98.5 F (36.9 C)  SpO2: 97%   Vitals:   02/22/21 0848  Weight: 260 lb (117.9 kg)  Height: 5\' 10"  (1.778 m)   Body mass index is 37.31 kg/m.  General: Well Developed, well nourished, and in no acute distress.  Neuro: Alert and oriented x3, extra-ocular muscles intact, sensation grossly intact. Cranial nerves II through XII are intact, motor, sensory, and coordinative functions are all intact. HEENT: Normocephalic, atraumatic, pupils equal round reactive to light, neck supple, no masses, no lymphadenopathy, thyroid nonpalpable. Oropharynx, nasopharynx, external ear canals are unremarkable. Skin: Warm and dry, no rashes noted.  Cardiac: Regular rate and rhythm, no murmurs rubs or gallops. No peripheral edema. Pulses symmetric. Respiratory: Clear to auscultation bilaterally. Not using accessory muscles, speaking in full sentences.  Abdominal: Soft, nontender, nondistended, positive bowel sounds, no masses, no organomegaly. Musculoskeletal: Shoulder, elbow, wrist, hip, knee, ankle stable, and with full range  of motion.  Mild right greater than left paraspinal tenderness with trigger point.  Impression and Recommendations:   The patient was counselled, risk factors were discussed, and anticipatory guidance given.  OSA (obstructive sleep apnea) Documented to have CPAP, will reevaluate at follow-up  Cervical spondylosis with radiculopathy Interval excellent improvement in symptomatology and function, albeit  with scheduled celecoxib and tizanidine.  I have advised that her goal is to ultimately control symptoms without the need of continuous medications, to achieve this he is amenable to formal physical therapy.  We will continue these medications in the interim on an as-needed basis.  We will hold from prednisone.  We will follow-up on this issue in 4 weeks time.  Annual physical exam Annual examination completed, risk stratification labs ordered, anticipatory guidance provided.  He did provide updated COVID vaccination information today.  Elevated blood pressure reading without diagnosis of hypertension Persistently elevated blood pressure readings on serial visits, he is asymptomatic, risk stratification labs ordered.  He will return in 4 weeks for reevaluation, pending labs and blood pressure readings at that return, pharmacotherapy most likely to be initiated.  Orders & Medications Medications: No orders of the defined types were placed in this encounter.  Orders Placed This Encounter  Procedures   TSH Rfx on Abnormal to Free T4   Lipid panel   CBC with Differential/Platelet   Comprehensive metabolic panel   VITAMIN D 25 Hydroxy (Vit-D Deficiency, Fractures)   Hepatitis C antibody   Ambulatory referral to Physical Therapy     Return in about 4 weeks (around 03/22/2021) for blood pressure f/u.    Jerrol Banana, MD   Primary Care Sports Medicine [facility]

## 2021-02-22 NOTE — Assessment & Plan Note (Signed)
Annual examination completed, risk stratification labs ordered, anticipatory guidance provided.  He did provide updated COVID vaccination information today.

## 2021-02-22 NOTE — Assessment & Plan Note (Signed)
Interval excellent improvement in symptomatology and function, albeit with scheduled celecoxib and tizanidine.  I have advised that her goal is to ultimately control symptoms without the need of continuous medications, to achieve this he is amenable to formal physical therapy.  We will continue these medications in the interim on an as-needed basis.  We will hold from prednisone.  We will follow-up on this issue in 4 weeks time.

## 2021-02-23 LAB — COMPREHENSIVE METABOLIC PANEL
ALT: 29 IU/L (ref 0–44)
AST: 24 IU/L (ref 0–40)
Albumin/Globulin Ratio: 2.1 (ref 1.2–2.2)
Albumin: 4.9 g/dL (ref 4.0–5.0)
Alkaline Phosphatase: 89 IU/L (ref 44–121)
BUN/Creatinine Ratio: 11 (ref 9–20)
BUN: 11 mg/dL (ref 6–20)
Bilirubin Total: 0.4 mg/dL (ref 0.0–1.2)
CO2: 24 mmol/L (ref 20–29)
Calcium: 9.9 mg/dL (ref 8.7–10.2)
Chloride: 102 mmol/L (ref 96–106)
Creatinine, Ser: 0.96 mg/dL (ref 0.76–1.27)
Globulin, Total: 2.3 g/dL (ref 1.5–4.5)
Glucose: 93 mg/dL (ref 70–99)
Potassium: 4.5 mmol/L (ref 3.5–5.2)
Sodium: 140 mmol/L (ref 134–144)
Total Protein: 7.2 g/dL (ref 6.0–8.5)
eGFR: 105 mL/min/{1.73_m2} (ref >59)

## 2021-02-23 LAB — CBC WITH DIFFERENTIAL/PLATELET
Basophils Absolute: 0 10*3/uL (ref 0.0–0.2)
Basos: 1 %
EOS (ABSOLUTE): 0.1 10*3/uL (ref 0.0–0.4)
Eos: 4 %
Hematocrit: 44.3 % (ref 37.5–51.0)
Hemoglobin: 14.6 g/dL (ref 13.0–17.7)
Immature Grans (Abs): 0 10*3/uL (ref 0.0–0.1)
Immature Granulocytes: 1 %
Lymphocytes Absolute: 2.3 10*3/uL (ref 0.7–3.1)
Lymphs: 56 %
MCH: 27 pg (ref 26.6–33.0)
MCHC: 33 g/dL (ref 31.5–35.7)
MCV: 82 fL (ref 79–97)
Monocytes Absolute: 0.3 10*3/uL (ref 0.1–0.9)
Monocytes: 6 %
Neutrophils Absolute: 1.3 10*3/uL — ABNORMAL LOW (ref 1.4–7.0)
Neutrophils: 32 %
Platelets: 246 10*3/uL (ref 150–450)
RBC: 5.41 x10E6/uL (ref 4.14–5.80)
RDW: 13.8 % (ref 11.6–15.4)
WBC: 4 10*3/uL (ref 3.4–10.8)

## 2021-02-23 LAB — LIPID PANEL
Chol/HDL Ratio: 5.7 ratio — ABNORMAL HIGH (ref 0.0–5.0)
Cholesterol, Total: 164 mg/dL (ref 100–199)
HDL: 29 mg/dL — ABNORMAL LOW (ref >39)
LDL Chol Calc (NIH): 108 mg/dL — ABNORMAL HIGH (ref 0–99)
Triglycerides: 152 mg/dL — ABNORMAL HIGH (ref 0–149)
VLDL Cholesterol Cal: 27 mg/dL (ref 5–40)

## 2021-02-23 LAB — HEPATITIS C ANTIBODY: Hep C Virus Ab: <0.1 s/co ratio (ref 0.0–0.9)

## 2021-02-23 LAB — VITAMIN D 25 HYDROXY (VIT D DEFICIENCY, FRACTURES): Vit D, 25-Hydroxy: 11.6 ng/mL — ABNORMAL LOW (ref 30.0–100.0)

## 2021-02-23 LAB — TSH RFX ON ABNORMAL TO FREE T4: TSH: 1.15 u[IU]/mL (ref 0.450–4.500)

## 2021-02-28 ENCOUNTER — Other Ambulatory Visit: Payer: Self-pay | Admitting: Family Medicine

## 2021-02-28 ENCOUNTER — Encounter: Payer: Self-pay | Admitting: Family Medicine

## 2021-02-28 DIAGNOSIS — M4722 Other spondylosis with radiculopathy, cervical region: Secondary | ICD-10-CM

## 2021-02-28 DIAGNOSIS — E559 Vitamin D deficiency, unspecified: Secondary | ICD-10-CM

## 2021-02-28 MED ORDER — VITAMIN D (ERGOCALCIFEROL) 1.25 MG (50000 UNIT) PO CAPS: 50000.0000 [IU] | ORAL_CAPSULE | ORAL | 0 refills | Status: DC

## 2021-02-28 MED ORDER — CELECOXIB 200 MG PO CAPS: 200.0000 mg | ORAL_CAPSULE | Freq: Two times a day (BID) | ORAL | 0 refills | Status: DC | PRN

## 2021-03-01 ENCOUNTER — Other Ambulatory Visit: Payer: Self-pay | Admitting: Family Medicine

## 2021-03-01 DIAGNOSIS — M4722 Other spondylosis with radiculopathy, cervical region: Secondary | ICD-10-CM

## 2021-03-02 NOTE — Telephone Encounter (Signed)
[facility] called and spoke to [place], Pensions consultant to verify that prescription was received and has been filled at location. Ready for pick up.   Requested Prescriptions  Pending Prescriptions Disp Refills  . celecoxib (CELEBREX) 200 MG capsule [Pharmacy Med Name: CELECOXIB 200 MG CAPSULE] 60 capsule 0    Sig: TAKE 1 CAPSULE (200 MG TOTAL) BY MOUTH 2 (TWO) TIMES DAILY. ONE TO 2 TABLETS BY MOUTH DAILY AS NEEDED FOR PAIN.     Analgesics:  COX2 Inhibitors Passed - 03/01/2021  2:46 PM      Passed - HGB in normal range and within 360 days    Hemoglobin  Date Value Ref Range Status  02/22/2021 14.6 13.0 - 17.7 g/dL Final         Passed - Cr in normal range and within 360 days    Creatinine, Ser  Date Value Ref Range Status  02/22/2021 0.96 0.76 - 1.27 mg/dL Final         Passed - Patient is not pregnant      Passed - Valid encounter within last 12 months    Recent Outpatient Visits          1 week ago Annual physical exam   [facility] Jerrol Banana, MD   1 month ago Cervical spondylosis with radiculopathy   [facility] Jerrol Banana, MD      Future Appointments            In 2 weeks Ashley Royalty, Ocie Bob, MD [facility]

## 2021-03-22 ENCOUNTER — Other Ambulatory Visit: Payer: Self-pay | Admitting: Family Medicine

## 2021-03-22 ENCOUNTER — Ambulatory Visit: Payer: BC Managed Care – PPO | Admitting: Family Medicine

## 2021-03-22 DIAGNOSIS — E559 Vitamin D deficiency, unspecified: Secondary | ICD-10-CM

## 2021-03-22 NOTE — Telephone Encounter (Signed)
Requested medications are due for refill today.  Seems a bit too soon  Requested medications are on the active medications list.  yes  Last refill. 02/28/2021  Future visit scheduled.   no  Notes to clinic.  Medication not delegated.

## 2021-03-28 ENCOUNTER — Encounter: Payer: Self-pay | Admitting: Family Medicine

## 2021-03-29 ENCOUNTER — Other Ambulatory Visit: Payer: Self-pay | Admitting: Family Medicine

## 2021-03-29 DIAGNOSIS — M4722 Other spondylosis with radiculopathy, cervical region: Secondary | ICD-10-CM

## 2021-03-29 NOTE — Telephone Encounter (Signed)
Please advise for any recommendations.

## 2021-03-29 NOTE — Telephone Encounter (Signed)
Requested Prescriptions  Pending Prescriptions Disp Refills  . celecoxib (CELEBREX) 200 MG capsule [Pharmacy Med Name: CELECOXIB 200 MG CAPSULE] 60 capsule 0    Sig: TAKE 1 CAPSULE (200 MG TOTAL) BY MOUTH 2 (TWO) TIMES DAILY AS NEEDED FOR MODERATE PAIN.     Analgesics:  COX2 Inhibitors Passed - 03/29/2021  1:26 AM      Passed - HGB in normal range and within 360 days    Hemoglobin  Date Value Ref Range Status  02/22/2021 14.6 13.0 - 17.7 g/dL Final         Passed - Cr in normal range and within 360 days    Creatinine, Ser  Date Value Ref Range Status  02/22/2021 0.96 0.76 - 1.27 mg/dL Final         Passed - Patient is not pregnant      Passed - Valid encounter within last 12 months    Recent Outpatient Visits          1 month ago Annual physical exam   [facility] Jerrol Banana, MD   2 months ago Cervical spondylosis with radiculopathy   [facility] Jerrol Banana, MD

## 2021-05-01 ENCOUNTER — Telehealth: Payer: Self-pay

## 2021-05-01 NOTE — Telephone Encounter (Signed)
Patient needs appointment to follow-up on blood pressure.  Please schedule.

## 2021-05-01 NOTE — Telephone Encounter (Signed)
Left voice mail to have patient set up appointment for blood pressure

## 2021-05-17 ENCOUNTER — Encounter: Payer: Self-pay | Admitting: Family Medicine

## 2021-07-16 ENCOUNTER — Other Ambulatory Visit: Payer: Self-pay | Admitting: Family Medicine

## 2021-07-16 DIAGNOSIS — E559 Vitamin D deficiency, unspecified: Secondary | ICD-10-CM

## 2021-07-17 NOTE — Telephone Encounter (Signed)
Requested medications are due for refill today.  yes ? ?Requested medications are on the active medications list.  yes ? ?Last refill. 02/28/2021 #8 0 refills ? ?Future visit scheduled.   no ? ?Notes to clinic.  Medication needs manual review. ? ? ? ?Requested Prescriptions  ?Pending Prescriptions Disp Refills  ? Vitamin D, Ergocalciferol, (DRISDOL) 1.25 MG (50000 UNIT) CAPS capsule [Pharmacy Med Name: VITAMIN D2 1.25MG (50,000 UNIT)] 4 capsule 1  ?  Sig: Take 1 capsule (50,000 Units total) by mouth every 7 (seven) days. Take for 8 total doses(weeks)  ?  ? Endocrinology:  Vitamins - Vitamin D Supplementation 2 Failed - 07/16/2021  8:40 AM  ?  ?  Failed - Manual Review: Route requests for 50,000 IU strength to the provider  ?  ?  Failed - Vitamin D in normal range and within 360 days  ?  Vit D, 25-Hydroxy  ?Date Value Ref Range Status  ?02/22/2021 11.6 (L) 30.0 - 100.0 ng/mL Final  ?  Comment:  ?  Vitamin D deficiency has been defined by the Institute of ?Medicine and an Endocrine Society practice guideline as a ?level of serum 25-OH vitamin D less than 20 ng/mL (1,2). ?The Endocrine Society went on to further define vitamin D ?insufficiency as a level between 21 and 29 ng/mL (2). ?1. IOM (Institute of Medicine). 2010. Dietary reference ?   intakes for calcium and D. [place]: The ?   [employer]. ?2. Holick MF, [place], Bischoff-Ferrari HA, et al. ?   Evaluation, treatment, and prevention of vitamin D ?   deficiency: an Endocrine Society clinical practice ?   guideline. JCEM. 2011 Jul; 96(7):1911-30. ?  ?  ?  ?  ?  Passed - Ca in normal range and within 360 days  ?  Calcium  ?Date Value Ref Range Status  ?02/22/2021 9.9 8.7 - 10.2 mg/dL Final  ?  ?  ?  ?  Passed - Valid encounter within last 12 months  ?  Recent Outpatient Visits   ? ?      ? 4 months ago Annual physical exam  ? [facility] Jerrol Banana, MD  ? 5 months ago Cervical spondylosis with radiculopathy  ? [facility] Jerrol Banana, MD  ? ?  ?  ? ?  ?  ?  ?  ?

## 2021-07-23 ENCOUNTER — Other Ambulatory Visit: Payer: Self-pay | Admitting: Family Medicine

## 2021-07-23 DIAGNOSIS — M4722 Other spondylosis with radiculopathy, cervical region: Secondary | ICD-10-CM

## 2021-07-24 NOTE — Telephone Encounter (Signed)
Requested Prescriptions  ?Pending Prescriptions Disp Refills  ?? celecoxib (CELEBREX) 200 MG capsule [Pharmacy Med Name: CELECOXIB 200 MG CAPSULE] 60 capsule 0  ?  Sig: TAKE 1 CAPSULE (200 MG TOTAL) BY MOUTH 2 (TWO) TIMES DAILY AS NEEDED FOR MODERATE PAIN.  ?  ? Analgesics:  COX2 Inhibitors Failed - 07/23/2021  9:53 AM  ?  ?  Failed - Manual Review: Labs are only required if the patient has taken medication for more than 8 weeks.  ?  ?  Passed - HGB in normal range and within 360 days  ?  Hemoglobin  ?Date Value Ref Range Status  ?02/22/2021 14.6 13.0 - 17.7 g/dL Final  ?   ?  ?  Passed - Cr in normal range and within 360 days  ?  Creatinine, Ser  ?Date Value Ref Range Status  ?02/22/2021 0.96 0.76 - 1.27 mg/dL Final  ?   ?  ?  Passed - HCT in normal range and within 360 days  ?  Hematocrit  ?Date Value Ref Range Status  ?02/22/2021 44.3 37.5 - 51.0 % Final  ?   ?  ?  Passed - AST in normal range and within 360 days  ?  AST  ?Date Value Ref Range Status  ?02/22/2021 24 0 - 40 IU/L Final  ?   ?  ?  Passed - ALT in normal range and within 360 days  ?  ALT  ?Date Value Ref Range Status  ?02/22/2021 29 0 - 44 IU/L Final  ?   ?  ?  Passed - eGFR is 30 or above and within 360 days  ?  GFR  ?Date Value Ref Range Status  ?04/07/2019 117.89 >60.00 mL/min Final  ? ?eGFR  ?Date Value Ref Range Status  ?02/22/2021 105 >59 mL/min/1.73 Final  ?   ?  ?  Passed - Patient is not pregnant  ?  ?  Passed - Valid encounter within last 12 months  ?  Recent Outpatient Visits   ?      ? 5 months ago Annual physical exam  ? [facility] Montel Culver, MD  ? 6 months ago Cervical spondylosis with radiculopathy  ? [facility] Montel Culver, MD  ?  ?  ? ?  ?  ?  ? ?

## 2021-10-12 ENCOUNTER — Other Ambulatory Visit: Payer: Self-pay

## 2021-10-12 ENCOUNTER — Emergency Department
Admission: EM | Admit: 2021-10-12 | Discharge: 2021-10-12 | Disposition: A | Discharge status: Home / Self Care | Payer: BC Managed Care – PPO | Attending: Emergency Medicine | Admitting: Emergency Medicine

## 2021-10-12 ENCOUNTER — Ambulatory Visit: Payer: BC Managed Care – PPO | Admitting: Family Medicine

## 2021-10-12 DIAGNOSIS — S39012A Strain of muscle, fascia and tendon of lower back, initial encounter: Secondary | ICD-10-CM | POA: Diagnosis not present

## 2021-10-12 DIAGNOSIS — Y92810 Car as the place of occurrence of the external cause: Secondary | ICD-10-CM | POA: Diagnosis not present

## 2021-10-12 DIAGNOSIS — M5432 Sciatica, left side: Secondary | ICD-10-CM | POA: Insufficient documentation

## 2021-10-12 DIAGNOSIS — S3992XA Unspecified injury of lower back, initial encounter: Secondary | ICD-10-CM | POA: Diagnosis present

## 2021-10-12 DIAGNOSIS — X500XXA Overexertion from strenuous movement or load, initial encounter: Secondary | ICD-10-CM | POA: Diagnosis not present

## 2021-10-12 DIAGNOSIS — J069 Acute upper respiratory infection, unspecified: Secondary | ICD-10-CM | POA: Insufficient documentation

## 2021-10-12 DIAGNOSIS — Z20822 Contact with and (suspected) exposure to covid-19: Secondary | ICD-10-CM | POA: Insufficient documentation

## 2021-10-12 LAB — SARS CORONAVIRUS 2 BY RT PCR: SARS Coronavirus 2 by RT PCR: NEGATIVE

## 2021-10-12 LAB — GROUP A STREP BY PCR: Group A Strep by PCR: NOT DETECTED

## 2021-10-12 MED ORDER — ACETAMINOPHEN 500 MG PO TABS
1000.0000 mg | ORAL_TABLET | Freq: Once | ORAL | Status: AC
  Administered 2021-10-12: 1000 mg via ORAL
  Filled 2021-10-12: qty 2

## 2021-10-12 MED ORDER — KETOROLAC TROMETHAMINE 30 MG/ML IJ SOLN
30.0000 mg | Freq: Once | INTRAMUSCULAR | Status: AC
  Administered 2021-10-12: 30 mg via INTRAMUSCULAR
  Filled 2021-10-12: qty 1

## 2021-10-12 MED ORDER — METHOCARBAMOL 500 MG PO TABS: 500.0000 mg | ORAL_TABLET | Freq: Three times a day (TID) | ORAL | 0 refills | Status: DC | PRN

## 2021-10-12 MED ORDER — METHOCARBAMOL 500 MG PO TABS
500.0000 mg | ORAL_TABLET | Freq: Once | ORAL | Status: AC
  Administered 2021-10-12: 500 mg via ORAL
  Filled 2021-10-12: qty 1

## 2021-10-12 MED ORDER — LIDOCAINE 5 % EX PTCH
1.0000 | MEDICATED_PATCH | CUTANEOUS | Status: DC
  Administered 2021-10-12: 1 via TRANSDERMAL
  Filled 2021-10-12: qty 1

## 2021-10-12 MED ORDER — LIDOCAINE 5 % EX PTCH: 1.0000 | MEDICATED_PATCH | Freq: Two times a day (BID) | CUTANEOUS | 0 refills | Status: DC

## 2021-10-12 NOTE — Discharge Instructions (Addendum)
Please take Tylenol and ibuprofen/Advil for your pain.  It is safe to take them together, or to alternate them every few hours.  Take up to 1000mg  of Tylenol at a time, up to 4 times per day.  Do not take more than 4000 mg of Tylenol in 24 hours.  For ibuprofen, take 400-600 mg, 3 - 4 times per day.  Please use lidocaine patches at your site of pain.  Apply 1 patch at a time, leave on for 12 hours, then remove for 12 hours.  12 hours on, 12 hours off.  Do not apply more than 1 patch at a time.  Use Robaxin muscle relaxer 3-4 times per day as needed for more severe/breakthrough pain.

## 2021-10-12 NOTE — ED Triage Notes (Signed)
Pt here with back pain last Sunday while helping a family member get in the car. Pt states congestion that has gotten worse since yesterday. Pt still able to move but hurts a lot. Pt states back pain is all over.

## 2021-10-12 NOTE — ED Provider Notes (Signed)
[facility] Provider Note    Event Date/Time   First MD Initiated Contact with Patient 10/12/21 1214     (approximate)   History   Back Pain and Nasal Congestion   HPI  Ian Romero is a 36 y.o. male who presents to the ED for evaluation of Back Pain and Nasal Congestion   I reviewed PCP visit from October.  Obese patient with history of GERD, HLD, allergies and chronic headaches.  Patient presents to the ED for evaluation of sore throat, upper respiratory congestion and a week of back pain.  He reports his father-in-law unfortunately had a stroke and so patient and his wife have been helping him out and he has been living with them.  Patient has been helping move him, and he had a lifting injury about a week ago.  Denies falls or trauma.  Reports lumbar pain, minimally improved with Tylenol since then.  Reports a tightness sensation to his bilateral lower back, rating down his left leg.  Further reports couple days of upper respiratory congestion and sore throat.  Reports awakening this morning with a "bad sinus headache." Try to see his doctor today, but they could not fit him in so he wanted to get checked out.  Denies fevers, productive cough, abdominal or chest pain.  Physical Exam   Triage Vital Signs: ED Triage Vitals [10/12/21 1147]  Enc Vitals Group     BP (!) 188/120     Pulse Rate 77     Resp 18     Temp 98.3 F (36.8 C)     Temp Source Oral     SpO2 97 %     Weight 255 lb (115.7 kg)     Height 5\' 10"  (1.778 m)     Head Circumference      Peak Flow      Pain Score 10     Pain Loc      Pain Edu?      Excl. in GC?     Most recent vital signs: Vitals:   10/12/21 1147  BP: (!) 188/120  Pulse: 77  Resp: 18  Temp: 98.3 F (36.8 C)  SpO2: 97%    General: Awake, no distress.  Obese.  Pleasant and conversational. CV:  Good peripheral perfusion.  Resp:  Normal effort.  Abd:  No distention.  MSK:  No deformity noted.  Poorly  localizing left greater than right lumbar tenderness to palpation.  No midline tenderness or signs of trauma to the back. Neuro:  No focal deficits appreciated. Cranial nerves II through XII intact 5/5 strength and sensation in all 4 extremities Other:  Upper respiratory congestion is noted.  Posterior oropharyngeal erythema is noted.  Uvula is midline.   ED Results / Procedures / Treatments   Labs (all labs ordered are listed, but only abnormal results are displayed) Labs Reviewed  GROUP A STREP BY PCR  SARS CORONAVIRUS 2 BY RT PCR    EKG   RADIOLOGY   Official radiology report(s): No results found.  PROCEDURES and INTERVENTIONS:  Procedures  Medications  lidocaine (LIDODERM) 5 % 1 patch (1 patch Transdermal Patch Applied 10/12/21 1259)  acetaminophen (TYLENOL) tablet 1,000 mg (1,000 mg Oral Given 10/12/21 1256)  ketorolac (TORADOL) 30 MG/ML injection 30 mg (30 mg Intramuscular Given 10/12/21 1256)  methocarbamol (ROBAXIN) tablet 500 mg (500 mg Oral Given 10/12/21 1256)     IMPRESSION / MDM / ASSESSMENT AND PLAN / ED COURSE  I  reviewed the triage vital signs and the nursing notes.  Differential diagnosis includes, but is not limited to, strep throat, viral syndrome, lumbar strain, cauda equina  {Patient presents with symptoms of an acute illness or injury that is potentially life-threatening.  36 year old male presents to the ED with couple days of nasal congestion superimposed on 1 week of a lumbar strain.  He look systemically well.  Doubt pneumonia, sepsis or systemic illness.  Notifications for antibiotic administration.  No signs of cord compression medications for lumbar imaging.  No history of trauma.  We will discharge with recommendations for nonnarcotic multimodal analgesia and return precautions for the ED.  Clinical Course as of 10/12/21 1429  Thu Oct 12, 2021  1428 Reassessed.  Feeling a little bit better.  We discussed negative strep swab.  No indications for  antibiotics.  Discussed return precautions. [DS]    Clinical Course User Index [DS] Delton Prairie, MD     FINAL CLINICAL IMPRESSION(S) / ED DIAGNOSES   Final diagnoses:  Viral URI  Strain of lumbar region, initial encounter  Sciatica of left side     Rx / DC Orders   ED Discharge Orders          Ordered    lidocaine (LIDODERM) 5 %  Every 12 hours        10/12/21 1429    methocarbamol (ROBAXIN) 500 MG tablet  Every 8 hours PRN        10/12/21 1429             Note:  This document was prepared using Dragon voice recognition software and may include unintentional dictation errors.   Delton Prairie, MD 10/12/21 [phone]

## 2021-10-17 ENCOUNTER — Encounter: Payer: Self-pay | Admitting: Family Medicine

## 2021-10-17 ENCOUNTER — Ambulatory Visit
Admission: RE | Admit: 2021-10-17 | Discharge: 2021-10-17 | Disposition: A | Discharge status: Home / Self Care | Payer: BC Managed Care – PPO | Attending: Family Medicine | Admitting: Family Medicine

## 2021-10-17 ENCOUNTER — Ambulatory Visit
Admission: RE | Admit: 2021-10-17 | Discharge: 2021-10-17 | Disposition: A | Discharge status: Home / Self Care | Payer: BC Managed Care – PPO | Source: Ambulatory Visit | Attending: Family Medicine | Admitting: Family Medicine

## 2021-10-17 ENCOUNTER — Ambulatory Visit (INDEPENDENT_AMBULATORY_CARE_PROVIDER_SITE_OTHER): Payer: BC Managed Care – PPO | Admitting: Family Medicine

## 2021-10-17 VITALS — BP 180/130 | HR 75 | Ht 70.0 in | Wt 260.8 lb

## 2021-10-17 DIAGNOSIS — M546 Pain in thoracic spine: Secondary | ICD-10-CM

## 2021-10-17 DIAGNOSIS — M545 Low back pain, unspecified: Secondary | ICD-10-CM | POA: Insufficient documentation

## 2021-10-17 DIAGNOSIS — J069 Acute upper respiratory infection, unspecified: Secondary | ICD-10-CM | POA: Insufficient documentation

## 2021-10-17 DIAGNOSIS — M4722 Other spondylosis with radiculopathy, cervical region: Secondary | ICD-10-CM | POA: Diagnosis not present

## 2021-10-17 DIAGNOSIS — I10 Essential (primary) hypertension: Secondary | ICD-10-CM | POA: Diagnosis not present

## 2021-10-17 DIAGNOSIS — M47815 Spondylosis without myelopathy or radiculopathy, thoracolumbar region: Secondary | ICD-10-CM | POA: Insufficient documentation

## 2021-10-17 MED ORDER — LISINOPRIL-HYDROCHLOROTHIAZIDE 10-12.5 MG PO TABS: 1.0000 | ORAL_TABLET | Freq: Every day | ORAL | 1 refills | Status: DC

## 2021-10-17 MED ORDER — CELECOXIB 100 MG PO CAPS: 100.0000 mg | ORAL_CAPSULE | Freq: Two times a day (BID) | ORAL | 0 refills | Status: DC | PRN

## 2021-10-17 MED ORDER — PROMETHAZINE-DM 6.25-15 MG/5ML PO SYRP: 5.0000 mL | ORAL_SOLUTION | Freq: Four times a day (QID) | ORAL | 0 refills | Status: DC | PRN

## 2021-10-17 MED ORDER — PREDNISONE 50 MG PO TABS: 50.0000 mg | ORAL_TABLET | Freq: Every day | ORAL | 0 refills | Status: DC

## 2021-10-17 NOTE — Patient Instructions (Signed)
-   Obtain back x-rays - Start new BP medicine - Start prednisone - Can use Celebrex as-needed - Use muscle relaxer as previously prescribed - Start home exercises this weekend - Start OTC Mucinex - Use cough syrup as-needed - Return in 2 weeks

## 2021-10-17 NOTE — Progress Notes (Signed)
Primary Care / Sports Medicine Office Visit  Patient Information:  Patient ID: Ian Romero, male DOB: Dec 04, 1985 Age: 36 y.o. MRN: [id number]   Ian Romero is a pleasant 36 y.o. male presenting with the following:  Chief Complaint  Patient presents with   Hypertension   Back Pain    Extreme pain for 1 week, was helping father in law into car and felt something pull and has been in pain since, went to ER was given patch and Toradol shot and Robaxian, states none is helping.     Vitals:   10/17/21 1015  BP: (!) 180/130  Pulse: 75  SpO2: 97%   Vitals:   10/17/21 1015  Weight: 260 lb 12.8 oz (118.3 kg)  Height: 5\' 10"  (1.778 m)   Body mass index is 37.42 kg/m.  No results found.   Independent interpretation of notes and tests performed by another provider:   None  Procedures performed:   None  Pertinent History, Exam, Impression, and Recommendations:   Problem List Items Addressed This Visit       Cardiovascular and Mediastinum   Hypertension    Persistent elevation, patient has described compliance issues with amlodipine.  Is amenable to restart new BP medication and maintain follow-up, understands risks for persistent untreated hypertension.  Cardiopulmonary findings are otherwise benign.  Plan for initiation of lisinopril/HCTZ 10- 25 mg, continue amlodipine, and close follow-up in 2 weeks.       Relevant Medications   amLODipine (NORVASC) 5 MG tablet   lisinopril-hydrochlorothiazide (ZESTORETIC) 10-12.5 MG tablet     Respiratory   Upper respiratory tract infection   Relevant Medications   predniSONE (DELTASONE) 50 MG tablet   promethazine-dextromethorphan (PROMETHAZINE-DM) 6.25-15 MG/5ML syrup     Other   Thoracolumbar back pain - Primary    Acute issue noted roughly 2 weeks back while lifting his father-in-law to aid in transition, had acute onset of thoracolumbar pain resulting in tightness, decreased mobility, and significant pain.  He did go to  urgent care on 10/17/2021 where he was provided with lidocaine patch, Robaxin, and was given a Toradol shot, unfortunately he had persistent symptomatology without improvement following that course.  He denies any radiating symptoms into the lower extremities, no bowel/bladder involvement, no weakness.  Examination shows positive right Faber, piriformis stretching, negative straight leg raise bilaterally, tenderness right greater than left paraspinal thoracolumbar region where there is prominent spasm, sensorimotor intact bilateral lower extremities.  History and findings are most consistent with thoracolumbar strain, cannot exclude element of intervertebral acute disc derangement, plan for dedicated x-rays given his comorbid history of cervical spine issues to assess for an element of chronicity.  Over the interim plan for scheduled prednisone, sparing usage of Celebrex due to hypertension, and he can continue the Robaxin as previously prescribed from the urgent care.  Plan for close follow-up in 2 weeks, can consider trigger point injections for residual focal symptomatology.       Relevant Medications   predniSONE (DELTASONE) 50 MG tablet   celecoxib (CELEBREX) 100 MG capsule   Other Relevant Orders   DG Thoracic Spine W/Swimmers   DG Lumbar Spine Complete     Orders & Medications Meds ordered this encounter  Medications   predniSONE (DELTASONE) 50 MG tablet    Sig: Take 1 tablet (50 mg total) by mouth daily.    Dispense:  5 tablet    Refill:  0   celecoxib (CELEBREX) 100 MG capsule    Sig: Take  1 capsule (100 mg total) by mouth 2 (two) times daily as needed.    Dispense:  30 capsule    Refill:  0   promethazine-dextromethorphan (PROMETHAZINE-DM) 6.25-15 MG/5ML syrup    Sig: Take 5 mLs by mouth 4 (four) times daily as needed for cough.    Dispense:  118 mL    Refill:  0   lisinopril-hydrochlorothiazide (ZESTORETIC) 10-12.5 MG tablet    Sig: Take 1 tablet by mouth daily.    Dispense:   30 tablet    Refill:  1   Orders Placed This Encounter  Procedures   DG Thoracic Spine W/Swimmers   DG Lumbar Spine Complete    I provided a total time of 42 minutes including both face-to-face and non-face-to-face time on 10/17/2021 inclusive of time utilized for medical chart review, information gathering, care coordination with staff, and documentation completion.  Specifically reviewed prior blood pressure readings, treatments at that time, significant elevation over the interim, associated risks of untreated hypertension, ways to treat hypertension from both a pharmacologic and lifestyle modification standpoint, and need for close follow-up.  Additionally the nature of his presenting thoracolumbar symptoms were reviewed and plan formulated.   Return in about 2 weeks (around 10/31/2021).     Jerrol Banana, MD   Primary Care Sports Medicine [facility]

## 2021-10-17 NOTE — Assessment & Plan Note (Signed)
Persistent elevation, patient has described compliance issues with amlodipine.  Is amenable to restart new BP medication and maintain follow-up, understands risks for persistent untreated hypertension.  Cardiopulmonary findings are otherwise benign.  Plan for initiation of lisinopril/HCTZ 10- 25 mg, continue amlodipine, and close follow-up in 2 weeks.

## 2021-10-17 NOTE — Assessment & Plan Note (Signed)
Acute issue noted roughly 2 weeks back while lifting his father-in-law to aid in transition, had acute onset of thoracolumbar pain resulting in tightness, decreased mobility, and significant pain.  He did go to urgent care on 10/17/2021 where he was provided with lidocaine patch, Robaxin, and was given a Toradol shot, unfortunately he had persistent symptomatology without improvement following that course.  He denies any radiating symptoms into the lower extremities, no bowel/bladder involvement, no weakness.  Examination shows positive right Faber, piriformis stretching, negative straight leg raise bilaterally, tenderness right greater than left paraspinal thoracolumbar region where there is prominent spasm, sensorimotor intact bilateral lower extremities.  History and findings are most consistent with thoracolumbar strain, cannot exclude element of intervertebral acute disc derangement, plan for dedicated x-rays given his comorbid history of cervical spine issues to assess for an element of chronicity.  Over the interim plan for scheduled prednisone, sparing usage of Celebrex due to hypertension, and he can continue the Robaxin as previously prescribed from the urgent care.  Plan for close follow-up in 2 weeks, can consider trigger point injections for residual focal symptomatology.

## 2021-10-28 ENCOUNTER — Other Ambulatory Visit: Payer: Self-pay | Admitting: Family Medicine

## 2021-10-28 DIAGNOSIS — M545 Low back pain, unspecified: Secondary | ICD-10-CM

## 2021-10-30 NOTE — Telephone Encounter (Signed)
Requested Prescriptions  Pending Prescriptions Disp Refills  . celecoxib (CELEBREX) 100 MG capsule [Pharmacy Med Name: CELECOXIB 100 MG CAPSULE] 30 capsule 1    Sig: TAKE 1 CAPSULE BY MOUTH 2 TIMES DAILY AS NEEDED.     Analgesics:  COX2 Inhibitors Failed - 10/28/2021  1:06 PM      Failed - Manual Review: Labs are only required if the patient has taken medication for more than 8 weeks.      Passed - HGB in normal range and within 360 days    Hemoglobin  Date Value Ref Range Status  02/22/2021 14.6 13.0 - 17.7 g/dL Final         Passed - Cr in normal range and within 360 days    Creatinine, Ser  Date Value Ref Range Status  02/22/2021 0.96 0.76 - 1.27 mg/dL Final         Passed - HCT in normal range and within 360 days    Hematocrit  Date Value Ref Range Status  02/22/2021 44.3 37.5 - 51.0 % Final         Passed - AST in normal range and within 360 days    AST  Date Value Ref Range Status  02/22/2021 24 0 - 40 IU/L Final         Passed - ALT in normal range and within 360 days    ALT  Date Value Ref Range Status  02/22/2021 29 0 - 44 IU/L Final         Passed - eGFR is 30 or above and within 360 days    GFR  Date Value Ref Range Status  04/07/2019 117.89 >60.00 mL/min Final   eGFR  Date Value Ref Range Status  02/22/2021 105 >59 mL/min/1.73 Final         Passed - Patient is not pregnant      Passed - Valid encounter within last 12 months    Recent Outpatient Visits          1 week ago Thoracolumbar back pain   [facility] Montel Culver, MD   8 months ago Annual physical exam   [facility] Montel Culver, MD   9 months ago Cervical spondylosis with radiculopathy   [facility] Montel Culver, MD      Future Appointments            In 1 week Zigmund Daniel, Earley Abide, MD [facility] [place]

## 2021-11-08 ENCOUNTER — Ambulatory Visit: Payer: BC Managed Care – PPO | Admitting: Family Medicine

## 2021-11-08 ENCOUNTER — Encounter: Payer: Self-pay | Admitting: Family Medicine

## 2021-11-08 ENCOUNTER — Other Ambulatory Visit: Payer: Self-pay | Admitting: Family Medicine

## 2021-11-08 DIAGNOSIS — I10 Essential (primary) hypertension: Secondary | ICD-10-CM | POA: Diagnosis not present

## 2021-11-08 DIAGNOSIS — M47815 Spondylosis without myelopathy or radiculopathy, thoracolumbar region: Secondary | ICD-10-CM | POA: Diagnosis not present

## 2021-11-08 DIAGNOSIS — M47817 Spondylosis without myelopathy or radiculopathy, lumbosacral region: Secondary | ICD-10-CM

## 2021-11-08 MED ORDER — LISINOPRIL-HYDROCHLOROTHIAZIDE 20-25 MG PO TABS: 1.0000 | ORAL_TABLET | Freq: Every day | ORAL | 0 refills | Status: DC

## 2021-11-08 MED ORDER — CELECOXIB 200 MG PO CAPS: 200.0000 mg | ORAL_CAPSULE | Freq: Two times a day (BID) | ORAL | 2 refills | Status: DC | PRN

## 2021-11-08 NOTE — Assessment & Plan Note (Signed)
Patient did demonstrate improved BP readings with initiation of lisinopril HCTZ, given persistently elevated readings, plan for further titration and follow-up at 6 weeks.

## 2021-11-08 NOTE — Assessment & Plan Note (Signed)
Patient with persistent symptomatology, given x-ray results and advised the patient various treatment strategies and he is amenable to formal physical therapy, modification of Celebrex dosing, and follow-up.

## 2021-11-08 NOTE — Progress Notes (Signed)
     Primary Care / Sports Medicine Office Visit  Patient Information:  Patient ID: Ian Romero, male DOB: 04-Mar-1986 Age: 36 y.o. MRN: [id number]   [name] is a pleasant 36 y.o. male presenting with the following:  Chief Complaint  Patient presents with   Follow-up    Pt states that his back pain is feeling better, would like to go back on the Celebrex 300mg  states it works better then the 100mg .    Hypertension    Refill BP medication    Vitals:   11/08/21 1408  BP: (!) 144/100  Pulse: 80  SpO2: 99%   Vitals:   11/08/21 1408  Weight: 267 lb 12.8 oz (121.5 kg)  Height: 5\' 10"  (1.778 m)   Body mass index is 38.43 kg/m.  DG Thoracic Spine W/Swimmers  Result Date: 10/17/2021 CLINICAL DATA:  Back pain EXAM: THORACIC SPINE - 3 VIEWS COMPARISON:  None Available. FINDINGS: Dextroscoliosis centered in the midthoracic spine. No fracture or spondylolisthesis identified. Intervertebral disc spaces appear preserved. IMPRESSION: Dextroscoliosis of the thoracic spine. Electronically Signed   By: 11/10/21 M.D.   On: 10/17/2021 15:20   DG Lumbar Spine Complete  Result Date: 10/17/2021 CLINICAL DATA:  Low back pain EXAM: LUMBAR SPINE - COMPLETE 4+ VIEW COMPARISON:  None Available. FINDINGS: Slight levocurvature of the thoracolumbar junction. Lumbar vertebral body height and alignment are otherwise preserved without fracture or spondylolisthesis. Mild intervertebral disc space narrowing at L4-L5 and L5-S1 and mild facet arthropathy in the lower lumbar spine. IMPRESSION: Mild degenerative changes with no acute fracture identified. Electronically Signed   By: Jannifer Hick M.D.   On: 10/17/2021 15:19     Independent interpretation of notes and tests performed by another provider:   Independent interpretation reveals subtle scoliotic curvature and straightening of thoracic curvature with somewhat abrupt lower lumbar lordosis, there is intervertebral narrowing at the L4-5 and L5-S1  levels.  Procedures performed:   None  Pertinent History, Exam, Impression, and Recommendations:   Problem List Items Addressed This Visit       Cardiovascular and Mediastinum   Hypertension    Patient did demonstrate improved BP readings with initiation of lisinopril HCTZ, given persistently elevated readings, plan for further titration and follow-up at 6 weeks.      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      Relevant Medications   celecoxib (CELEBREX) 200 MG capsule     Orders & Medications Meds ordered this encounter  Medications   lisinopril-hydrochlorothiazide (ZESTORETIC) 20-25 MG tablet    Sig: Take 1 tablet by mouth daily.    Dispense:  60 tablet    Refill:  0   celecoxib (CELEBREX) 200 MG capsule    Sig: Take 1 capsule (200 mg total) by mouth 2 (two) times daily as needed.    Dispense:  60 capsule    Refill:  2   Orders Placed This Encounter  Procedures   Ambulatory referral to Physical Therapy     Return in about 6 weeks (around 12/20/2021).     Jannifer Hick, MD   Primary Care Sports Medicine [facility]

## 2021-11-08 NOTE — Telephone Encounter (Signed)
Requested medication (s) are due for refill today:   Yes  Requested medication (s) are on the active medication list:   Yes  Future visit scheduled:   Yes today at 2:20   Last ordered: 10/17/2021 #30, 1 refill  Returned because labs are due per protocol and pt has an appt today.   Requested Prescriptions  Pending Prescriptions Disp Refills   lisinopril-hydrochlorothiazide (ZESTORETIC) 10-12.5 MG tablet [Pharmacy Med Name: LISINOPRIL-HCTZ 10-12.5 MG TAB] 30 tablet 1    Sig: TAKE 1 TABLET BY MOUTH EVERY DAY     Cardiovascular:  ACEI + Diuretic Combos Failed - 11/08/2021 12:34 PM      Failed - Na in normal range and within 180 days    Sodium  Date Value Ref Range Status  02/22/2021 140 134 - 144 mmol/L Final         Failed - K in normal range and within 180 days    Potassium  Date Value Ref Range Status  02/22/2021 4.5 3.5 - 5.2 mmol/L Final         Failed - Cr in normal range and within 180 days    Creatinine, Ser  Date Value Ref Range Status  02/22/2021 0.96 0.76 - 1.27 mg/dL Final         Failed - eGFR is 30 or above and within 180 days    GFR  Date Value Ref Range Status  04/07/2019 117.89 >60.00 mL/min Final   eGFR  Date Value Ref Range Status  02/22/2021 105 >59 mL/min/1.73 Final         Failed - Last BP in normal range    BP Readings from Last 1 Encounters:  10/17/21 (!) 180/130         Passed - Patient is not pregnant      Passed - Valid encounter within last 6 months    Recent Outpatient Visits           3 weeks ago Thoracolumbar back pain   [facility] Montel Culver, MD   8 months ago Annual physical exam   [facility] Montel Culver, MD   9 months ago Cervical spondylosis with radiculopathy   [facility] Montel Culver, MD       Future Appointments             Today Montel Culver, MD [facility] [place]

## 2021-11-09 ENCOUNTER — Ambulatory Visit: Payer: BC Managed Care – PPO | Admitting: Family Medicine

## 2021-12-20 ENCOUNTER — Encounter: Payer: Self-pay | Admitting: Family Medicine

## 2021-12-20 ENCOUNTER — Ambulatory Visit: Payer: BC Managed Care – PPO | Admitting: Family Medicine

## 2021-12-20 VITALS — BP 138/110 | HR 76 | Ht 70.0 in | Wt 267.0 lb

## 2021-12-20 DIAGNOSIS — I10 Essential (primary) hypertension: Secondary | ICD-10-CM

## 2021-12-20 DIAGNOSIS — M47817 Spondylosis without myelopathy or radiculopathy, lumbosacral region: Secondary | ICD-10-CM | POA: Diagnosis not present

## 2021-12-20 DIAGNOSIS — F411 Generalized anxiety disorder: Secondary | ICD-10-CM | POA: Diagnosis not present

## 2021-12-20 DIAGNOSIS — G4733 Obstructive sleep apnea (adult) (pediatric): Secondary | ICD-10-CM | POA: Diagnosis not present

## 2021-12-20 MED ORDER — SERTRALINE HCL 25 MG PO TABS: 25.0000 mg | ORAL_TABLET | Freq: Every day | ORAL | 0 refills | Status: DC

## 2021-12-20 MED ORDER — AMLODIPINE BESYLATE 5 MG PO TABS: 5.0000 mg | ORAL_TABLET | Freq: Every day | ORAL | 0 refills | Status: DC

## 2021-12-20 NOTE — Assessment & Plan Note (Signed)
Has been dealing with increased life stressors due to familial health issues as of late, may serve as a confounding element behind recalcitrant hypertension, plan for low-dose sertraline, follow-up for reevaluation.

## 2021-12-20 NOTE — Progress Notes (Signed)
     Primary Care / Sports Medicine Office Visit  Patient Information:  Patient ID: Ian Romero, male DOB: 1985/06/11 Age: 36 y.o. MRN: [id number]   Ian Romero is a pleasant 36 y.o. male presenting with the following:  Chief Complaint  Patient presents with   Back Pain    Feels better   Hypertension    Running little high at home, tolerating medication ok.     Vitals:   12/20/21 1052  BP: (!) 138/110  Pulse: 76  SpO2: 96%   Vitals:   12/20/21 1052  Weight: 267 lb (121.1 kg)  Height: 5\' 10"  (1.778 m)   Body mass index is 38.31 kg/m.  No results found.   Independent interpretation of notes and tests performed by another provider:   None  Procedures performed:   None  Pertinent History, Exam, Impression, and Recommendations:   Problem List Items Addressed This Visit       Cardiovascular and Mediastinum   Hypertension - Primary    Has been recalcitrant despite recent increase in lisinopril hydrochlorothiazide, patient endorses compliance with the same.  Plan to add amlodipine 5 mg, place home sleep study order, address comorbid anxiety, and place referral to cardiology.      Relevant Medications   amLODipine (NORVASC) 5 MG tablet   Other Relevant Orders   Ambulatory referral to Cardiology     Respiratory   OSA (obstructive sleep apnea)    States that in 2017 he was told he needed CPAP, was unable to follow through, has been left untreated.  Will confirm diagnosis of OSA with updated home sleep study, pending results, coordinate CPAP.        Musculoskeletal and Integument   Spondylosis of lumbosacral region without myelopathy or radiculopathy    Interim improvement, has not required Celebrex scheduled, I have encouraged him to limit intake of Celebrex due to comorbid hypertension and to focus on nonpharmacologic treatments.        Other   GAD (generalized anxiety disorder)    Has been dealing with increased life stressors due to familial health issues  as of late, may serve as a confounding element behind recalcitrant hypertension, plan for low-dose sertraline, follow-up for reevaluation.      Relevant Medications   sertraline (ZOLOFT) 25 MG tablet     Orders & Medications Meds ordered this encounter  Medications   amLODipine (NORVASC) 5 MG tablet    Sig: Take 1 tablet (5 mg total) by mouth daily.    Dispense:  90 tablet    Refill:  0   sertraline (ZOLOFT) 25 MG tablet    Sig: Take 1 tablet (25 mg total) by mouth daily.    Dispense:  90 tablet    Refill:  0   Orders Placed This Encounter  Procedures   Ambulatory referral to Cardiology     Return in about 2 months (around 02/19/2022).     04/21/2022, MD   Primary Care Sports Medicine [facility]

## 2021-12-20 NOTE — Assessment & Plan Note (Signed)
States that in 2017 he was told he needed CPAP, was unable to follow through, has been left untreated.  Will confirm diagnosis of OSA with updated home sleep study, pending results, coordinate CPAP.

## 2021-12-20 NOTE — Patient Instructions (Addendum)
-   Continue current lisinopril-hydrochlorothiazide blood pressure medication - Start amlodipine once daily - Start sertraline once daily - Perform home sleep study once obtained - Return for follow-up in 2 months

## 2021-12-20 NOTE — Assessment & Plan Note (Signed)
Has been recalcitrant despite recent increase in lisinopril hydrochlorothiazide, patient endorses compliance with the same.  Plan to add amlodipine 5 mg, place home sleep study order, address comorbid anxiety, and place referral to cardiology.

## 2021-12-20 NOTE — Assessment & Plan Note (Signed)
Interim improvement, has not required Celebrex scheduled, I have encouraged him to limit intake of Celebrex due to comorbid hypertension and to focus on nonpharmacologic treatments.

## 2021-12-31 ENCOUNTER — Other Ambulatory Visit: Payer: Self-pay | Admitting: Family Medicine

## 2021-12-31 DIAGNOSIS — I10 Essential (primary) hypertension: Secondary | ICD-10-CM

## 2022-01-02 NOTE — Telephone Encounter (Signed)
Requested Prescriptions  Pending Prescriptions Disp Refills  . lisinopril-hydrochlorothiazide (ZESTORETIC) 20-25 MG tablet [Pharmacy Med Name: LISINOPRIL-HCTZ 20-25 MG TAB] 60 tablet 0    Sig: TAKE 1 TABLET BY MOUTH EVERY DAY     Cardiovascular:  ACEI + Diuretic Combos Failed - 12/31/2021  8:31 AM      Failed - Na in normal range and within 180 days    Sodium  Date Value Ref Range Status  02/22/2021 140 134 - 144 mmol/L Final         Failed - K in normal range and within 180 days    Potassium  Date Value Ref Range Status  02/22/2021 4.5 3.5 - 5.2 mmol/L Final         Failed - Cr in normal range and within 180 days    Creatinine, Ser  Date Value Ref Range Status  02/22/2021 0.96 0.76 - 1.27 mg/dL Final         Failed - eGFR is 30 or above and within 180 days    GFR  Date Value Ref Range Status  04/07/2019 117.89 >60.00 mL/min Final   eGFR  Date Value Ref Range Status  02/22/2021 105 >59 mL/min/1.73 Final         Failed - Last BP in normal range    BP Readings from Last 1 Encounters:  12/20/21 (!) 138/110         Passed - Patient is not pregnant      Passed - Valid encounter within last 6 months    Recent Outpatient Visits          1 week ago Hypertension, unspecified type   [place] [facility] at [place], Earley Abide, MD   1 month ago Hypertension, unspecified type   [facility] and Sports Medicine at [place], [place]   2 months ago Thoracolumbar back pain   [place] [facility] and Sports Medicine at [place], Earley Abide, MD   10 months ago Annual physical exam   [facility] and Sports Medicine at [facility], Earley Abide, MD   11 months ago Cervical spondylosis with radiculopathy   [facility] and Sports Medicine at [facility], Earley Abide, MD      Future Appointments            In 1 month Zigmund Daniel, Earley Abide, MD [place] and Sports Medicine at [facility], [facility]

## 2022-01-16 ENCOUNTER — Other Ambulatory Visit: Payer: Self-pay | Admitting: Family Medicine

## 2022-01-16 DIAGNOSIS — I10 Essential (primary) hypertension: Secondary | ICD-10-CM

## 2022-01-17 NOTE — Telephone Encounter (Signed)
Requested Prescriptions  Pending Prescriptions Disp Refills  . lisinopril-hydrochlorothiazide (ZESTORETIC) 20-25 MG tablet [Pharmacy Med Name: LISINOPRIL-HCTZ 20-25 MG TAB] 90 tablet 1    Sig: TAKE 1 TABLET BY MOUTH EVERY DAY     Cardiovascular:  ACEI + Diuretic Combos Failed - 01/16/2022  8:57 AM      Failed - Na in normal range and within 180 days    Sodium  Date Value Ref Range Status  02/22/2021 140 134 - 144 mmol/L Final         Failed - K in normal range and within 180 days    Potassium  Date Value Ref Range Status  02/22/2021 4.5 3.5 - 5.2 mmol/L Final         Failed - Cr in normal range and within 180 days    Creatinine, Ser  Date Value Ref Range Status  02/22/2021 0.96 0.76 - 1.27 mg/dL Final         Failed - eGFR is 30 or above and within 180 days    GFR  Date Value Ref Range Status  04/07/2019 117.89 >60.00 mL/min Final   eGFR  Date Value Ref Range Status  02/22/2021 105 >59 mL/min/1.73 Final         Failed - Last BP in normal range    BP Readings from Last 1 Encounters:  12/20/21 (!) 138/110         Passed - Patient is not pregnant      Passed - Valid encounter within last 6 months    Recent Outpatient Visits          4 weeks ago Hypertension, unspecified type   [place] [facility] and Sports Medicine at [place], Earley Abide, MD   2 months ago Hypertension, unspecified type   [facility] Primary Care and Sports Medicine at [place], MD   3 months ago Thoracolumbar back pain   [place] [facility] and Sports Medicine at [place], Earley Abide, MD   10 months ago Annual physical exam   [facility] Primary Care and Sports Medicine at [facility], Earley Abide, MD   11 months ago Cervical spondylosis with radiculopathy   [facility] Primary Care and Sports Medicine at [facility], Earley Abide, MD      Future Appointments            In 1 month Zigmund Daniel, Earley Abide, MD [place] and Sports Medicine at [facility], [facility]

## 2022-01-26 ENCOUNTER — Encounter: Payer: Self-pay | Admitting: Family Medicine

## 2022-01-26 NOTE — Telephone Encounter (Signed)
Please advise 

## 2022-02-16 ENCOUNTER — Encounter: Payer: Self-pay | Admitting: Family Medicine

## 2022-02-16 NOTE — Telephone Encounter (Signed)
Please advise 

## 2022-02-19 ENCOUNTER — Encounter: Payer: Self-pay | Admitting: Family Medicine

## 2022-02-19 ENCOUNTER — Ambulatory Visit: Payer: BC Managed Care – PPO | Admitting: Family Medicine

## 2022-02-19 VITALS — BP 150/100 | HR 64 | Ht 70.0 in | Wt 261.0 lb

## 2022-02-19 DIAGNOSIS — M47817 Spondylosis without myelopathy or radiculopathy, lumbosacral region: Secondary | ICD-10-CM | POA: Diagnosis not present

## 2022-02-19 DIAGNOSIS — F411 Generalized anxiety disorder: Secondary | ICD-10-CM

## 2022-02-19 DIAGNOSIS — I10 Essential (primary) hypertension: Secondary | ICD-10-CM

## 2022-02-19 DIAGNOSIS — G4733 Obstructive sleep apnea (adult) (pediatric): Secondary | ICD-10-CM

## 2022-02-19 MED ORDER — LISINOPRIL-HYDROCHLOROTHIAZIDE 20-25 MG PO TABS: 1.0000 | ORAL_TABLET | Freq: Every day | ORAL | 0 refills | Status: DC

## 2022-02-19 MED ORDER — CELECOXIB 200 MG PO CAPS: 200.0000 mg | ORAL_CAPSULE | Freq: Two times a day (BID) | ORAL | 2 refills | Status: DC | PRN

## 2022-02-19 MED ORDER — AMLODIPINE BESYLATE 5 MG PO TABS: 5.0000 mg | ORAL_TABLET | Freq: Every day | ORAL | 0 refills | Status: DC

## 2022-02-19 MED ORDER — SERTRALINE HCL 50 MG PO TABS: 50.0000 mg | ORAL_TABLET | Freq: Every day | ORAL | 0 refills | Status: DC

## 2022-02-19 NOTE — Assessment & Plan Note (Signed)
Chronic, well controlled on sporadic dosing of Celebrex, refill today.

## 2022-02-19 NOTE — Assessment & Plan Note (Signed)
Chronic issue, ongoing symptoms.  Tolerating sertraline 25 mg well, improvements in mood related, is amenable to further titration of 50 mg for tighter control.  New Rx provided, reevaluation in 3 months.

## 2022-02-19 NOTE — Progress Notes (Signed)
     Primary Care / Sports Medicine Office Visit  Patient Information:  Patient ID: Ian Romero, male DOB: 04-Jun-1985 Age: 36 y.o. MRN: [id number]   Ian Romero is a pleasant 36 y.o. male presenting with the following:  No chief complaint on file.   Vitals:   02/19/22 1103  BP: (!) 150/100  Pulse: 64  SpO2: 98%   Vitals:   02/19/22 1103  Weight: 261 lb (118.4 kg)  Height: 5\' 10"  (1.778 m)   Body mass index is 37.45 kg/m.  No results found.   Independent interpretation of notes and tests performed by another provider:   None  Procedures performed:   None  Pertinent History, Exam, Impression, and Recommendations:   Problem List Items Addressed This Visit       Cardiovascular and Mediastinum   Hypertension - Primary    Chronic, uncontrolled, unfortunately patient discontinued lisinopril does hydrochlorothiazide as opposed to continuing this while adding amlodipine.  Patient encouraged to contact us for any questions/concerns, review MyChart visit patient plan.  Cardiopulmonary findings today are benign.  Restart/continue lisinopril-hydrochlorothiazide, continue amlodipine, reevaluate in 3 months.  He will be starting with CPAP which should provide added benefit on this issue.      Relevant Medications   lisinopril-hydrochlorothiazide (ZESTORETIC) 20-25 MG tablet   amLODipine (NORVASC) 5 MG tablet     Musculoskeletal and Integument   Spondylosis of lumbosacral region without myelopathy or radiculopathy    Chronic, well controlled on sporadic dosing of Celebrex, refill today.      Relevant Medications   celecoxib (CELEBREX) 200 MG capsule     Other   GAD (generalized anxiety disorder)    Chronic issue, ongoing symptoms.  Tolerating sertraline 25 mg well, improvements in mood related, is amenable to further titration of 50 mg for tighter control.  New Rx provided, reevaluation in 3 months.      Relevant Medications   sertraline (ZOLOFT) 50 MG tablet   Other  Visit Diagnoses     OSA on CPAP       Relevant Orders   Ambulatory referral to Sleep Studies        Orders & Medications Meds ordered this encounter  Medications   sertraline (ZOLOFT) 50 MG tablet    Sig: Take 1 tablet (50 mg total) by mouth daily.    Dispense:  90 tablet    Refill:  0   celecoxib (CELEBREX) 200 MG capsule    Sig: Take 1 capsule (200 mg total) by mouth 2 (two) times daily as needed.    Dispense:  60 capsule    Refill:  2   lisinopril-hydrochlorothiazide (ZESTORETIC) 20-25 MG tablet    Sig: Take 1 tablet by mouth daily.    Dispense:  90 tablet    Refill:  0   amLODipine (NORVASC) 5 MG tablet    Sig: Take 1 tablet (5 mg total) by mouth daily.    Dispense:  90 tablet    Refill:  0   Orders Placed This Encounter  Procedures   Ambulatory referral to Sleep Studies     Return in about 3 months (around 05/22/2022).     Montel Culver, MD   Primary Care Sports Medicine [place]

## 2022-02-19 NOTE — Patient Instructions (Signed)
-   Take new sertraline 50 mg dose daily - Continue amlodipine 5 mg daily - Restart/continue lisinopril-hydrochlorothiazide daily - Referral coordinator will contact you to schedule visit with sleep medicine group - Start CPAP nightly once obtained - Return for follow-up in 3 months

## 2022-02-19 NOTE — Assessment & Plan Note (Signed)
Chronic, uncontrolled, unfortunately patient discontinued lisinopril does hydrochlorothiazide as opposed to continuing this while adding amlodipine.  Patient encouraged to contact us for any questions/concerns, review MyChart visit patient plan.  Cardiopulmonary findings today are benign.  Restart/continue lisinopril-hydrochlorothiazide, continue amlodipine, reevaluate in 3 months.  He will be starting with CPAP which should provide added benefit on this issue.

## 2022-03-29 ENCOUNTER — Encounter: Payer: Self-pay | Admitting: Family Medicine

## 2022-03-30 ENCOUNTER — Other Ambulatory Visit: Payer: Self-pay | Admitting: Family Medicine

## 2022-03-30 DIAGNOSIS — M47817 Spondylosis without myelopathy or radiculopathy, lumbosacral region: Secondary | ICD-10-CM

## 2022-03-30 MED ORDER — CELECOXIB 200 MG PO CAPS: 200.0000 mg | ORAL_CAPSULE | Freq: Two times a day (BID) | ORAL | 2 refills | Status: DC | PRN

## 2022-03-30 MED ORDER — METHOCARBAMOL 750 MG PO TABS: 750.0000 mg | ORAL_TABLET | Freq: Three times a day (TID) | ORAL | 0 refills | Status: DC | PRN

## 2022-03-30 NOTE — Telephone Encounter (Signed)
Please advise 

## 2022-04-15 ENCOUNTER — Other Ambulatory Visit: Payer: Self-pay | Admitting: Family Medicine

## 2022-05-12 ENCOUNTER — Other Ambulatory Visit: Payer: Self-pay | Admitting: Family Medicine

## 2022-05-12 DIAGNOSIS — F411 Generalized anxiety disorder: Secondary | ICD-10-CM

## 2022-05-13 NOTE — Telephone Encounter (Signed)
Requested medication (s) are due for refill today: yes  Requested medication (s) are on the active medication list: yes  Last refill:  02/19/22 #90/0  Future visit scheduled: yes  Notes to clinic:   Requested Prescriptions  Pending Prescriptions Disp Refills   sertraline (ZOLOFT) 50 MG tablet [Pharmacy Med Name: SERTRALINE HCL 50 MG TABLET] 90 tablet 0    Sig: TAKE 1 TABLET BY MOUTH EVERY DAY     Psychiatry:  Antidepressants - SSRI - sertraline Failed - 05/12/2022 11:34 AM      Failed - AST in normal range and within 360 days    AST  Date Value Ref Range Status  02/22/2021 24 0 - 40 IU/L Final         Failed - ALT in normal range and within 360 days    ALT  Date Value Ref Range Status  02/22/2021 29 0 - 44 IU/L Final         Passed - Completed PHQ-2 or PHQ-9 in the last 360 days      Passed - Valid encounter within last 6 months    Recent Outpatient Visits           2 months ago Hypertension, unspecified type   [place] [facility] at [facility], Ian Bob, MD   4 months ago Hypertension, unspecified type   [facility] and [facility] at [facility], Ian Bob, MD   6 months ago Hypertension, unspecified type   [facility] and Sports Medicine at [facility], Ian Bob, MD   6 months ago Thoracolumbar back pain   [place] [facility] and [facility] Medicine at [facility], Ian Bob, MD   1 year ago Annual physical exam   [place] [facility] and [facility] Medicine at [facility], Ian Bob, MD       Future Appointments             In 1 week Ian Romero, Ian Bob, MD [facility] Primary Care and Sports Medicine at [facility], [facility]                  Requested Prescriptions  Pending Prescriptions Disp Refills   sertraline (ZOLOFT) 50 MG tablet [Pharmacy Med Name: SERTRALINE HCL 50 MG TABLET] 90 tablet 0    Sig: TAKE 1 TABLET BY MOUTH EVERY DAY      Psychiatry:  Antidepressants - SSRI - sertraline Failed - 05/12/2022 11:34 AM      Failed - AST in normal range and within 360 days    AST  Date Value Ref Range Status  02/22/2021 24 0 - 40 IU/L Final         Failed - ALT in normal range and within 360 days    ALT  Date Value Ref Range Status  02/22/2021 29 0 - 44 IU/L Final         Passed - Completed PHQ-2 or PHQ-9 in the last 360 days      Passed - Valid encounter within last 6 months    Recent Outpatient Visits           2 months ago Hypertension, unspecified type    [facility] and [facility] Medicine at [facility], Ian Bob, MD   4 months ago Hypertension, unspecified type   [facility] Primary Care and [facility] Medicine at [facility], Ian Bob, MD   6 months ago  Hypertension, unspecified type   [place] [facility] and Sports Medicine at [facility], Ian Abide, MD   6 months ago Thoracolumbar back pain   [facility] Care and [facility] Medicine at [facility], Ian Abide, MD   1 year ago Annual physical exam   [facility] Care and Sports Medicine at [facility], Ian Abide, MD       Future Appointments             In 1 week Ian Romero, Ian Abide, MD [place] Primary Care and Sports Medicine at [facility], [facility]

## 2022-05-22 ENCOUNTER — Ambulatory Visit: Payer: BC Managed Care – PPO | Admitting: Family Medicine

## 2022-05-22 ENCOUNTER — Encounter: Payer: Self-pay | Admitting: Family Medicine

## 2022-05-22 VITALS — BP 134/84 | HR 70 | Ht 70.0 in | Wt 266.0 lb

## 2022-05-22 DIAGNOSIS — I10 Essential (primary) hypertension: Secondary | ICD-10-CM

## 2022-05-22 DIAGNOSIS — G4733 Obstructive sleep apnea (adult) (pediatric): Secondary | ICD-10-CM | POA: Diagnosis not present

## 2022-05-22 DIAGNOSIS — M47817 Spondylosis without myelopathy or radiculopathy, lumbosacral region: Secondary | ICD-10-CM | POA: Diagnosis not present

## 2022-05-22 MED ORDER — CELECOXIB 400 MG PO CAPS: 400.0000 mg | ORAL_CAPSULE | Freq: Every day | ORAL | 0 refills | Status: DC | PRN

## 2022-05-22 MED ORDER — LISINOPRIL-HYDROCHLOROTHIAZIDE 20-25 MG PO TABS: 1.0000 | ORAL_TABLET | Freq: Every day | ORAL | 0 refills | Status: DC

## 2022-05-22 MED ORDER — MOMETASONE FUROATE 50 MCG/ACT NA SUSP: NASAL | 6 refills | Status: DC

## 2022-05-22 MED ORDER — AMLODIPINE BESYLATE 10 MG PO TABS: 10.0000 mg | ORAL_TABLET | Freq: Every day | ORAL | 1 refills | Status: DC

## 2022-05-27 NOTE — Assessment & Plan Note (Signed)
Will trial stronger Rx dose for PRN use

## 2022-05-27 NOTE — Assessment & Plan Note (Signed)
Compliant with CPAP, some issues with nasal pillow mask when congested despite Flonase, alternate intranasal steroid.

## 2022-05-27 NOTE — Progress Notes (Signed)
     Primary Care / Sports Medicine Office Visit  Patient Information:  Patient ID: Ian Romero, male DOB: 01-26-1986 Age: 37 y.o. MRN: [id number]   Ian Romero is a pleasant 37 y.o. male presenting with the following:  Chief Complaint  Patient presents with   Hypertension    Vitals:   05/22/22 1538  BP: 134/84  Pulse: 70  SpO2: 97%   Vitals:   05/22/22 1538  Weight: 266 lb (120.7 kg)  Height: 5\' 10"  (1.778 m)   Body mass index is 38.17 kg/m.  No results found.   Independent interpretation of notes and tests performed by another provider:   None  Procedures performed:   None  Pertinent History, Exam, Impression, and Recommendations:   Ian Romero was seen today for hypertension.  OSA (obstructive sleep apnea) Overview:    Assessment & Plan: Compliant with CPAP, some issues with nasal pillow mask when congested despite Flonase, alternate intranasal steroid.   Hypertension, unspecified type Assessment & Plan: Chronic, without adequate control, interval improved, will further titrate amlodipine to 10 mg and reassess at return.  Orders: -     Lisinopril-hydroCHLOROthiazide; Take 1 tablet by mouth daily.  Dispense: 90 tablet; Refill: 0  Spondylosis of lumbosacral region without myelopathy or radiculopathy Assessment & Plan: Will trial stronger Rx dose for PRN use  Orders: -     Celecoxib; Take 1 capsule (400 mg total) by mouth daily as needed.  Dispense: 90 capsule; Refill: 0  Other orders -     amLODIPine Besylate; Take 1 tablet (10 mg total) by mouth daily.  Dispense: 90 tablet; Refill: 1 -     Mometasone Furoate; One spray in each nostril twice a day, use left hand for right nostril, and right hand for left nostril.  Please dispense one bottle.  Dispense: 1 g; Refill: 6     Orders & Medications Meds ordered this encounter  Medications   lisinopril-hydrochlorothiazide (ZESTORETIC) 20-25 MG tablet    Sig: Take 1 tablet by mouth daily.    Dispense:   90 tablet    Refill:  0   amLODipine (NORVASC) 10 MG tablet    Sig: Take 1 tablet (10 mg total) by mouth daily.    Dispense:  90 tablet    Refill:  1   mometasone (NASONEX) 50 MCG/ACT nasal spray    Sig: One spray in each nostril twice a day, use left hand for right nostril, and right hand for left nostril.  Please dispense one bottle.    Dispense:  1 g    Refill:  6   celecoxib (CELEBREX) 400 MG capsule    Sig: Take 1 capsule (400 mg total) by mouth daily as needed.    Dispense:  90 capsule    Refill:  0   No orders of the defined types were placed in this encounter.    Return in about 4 weeks (around 06/19/2022) for CPE.     Montel Culver, MD, [facility]   Primary Care Sports Medicine Primary Care and Sports Medicine at [facility]

## 2022-05-27 NOTE — Assessment & Plan Note (Signed)
Chronic, without adequate control, interval improved, will further titrate amlodipine to 10 mg and reassess at return.

## 2022-05-31 ENCOUNTER — Telehealth: Payer: Self-pay | Admitting: Family Medicine

## 2022-05-31 NOTE — Telephone Encounter (Signed)
Copied from CRM #[id number]. Topic: Medical Record Request - Provider/Facility Request >> May 31, 2022  9:31 AM Everette C wrote: Reason for CRM: Lauren with [facility] has called to request the sleep study be faxed to them at [phone]  Please contact further if needed 

## 2022-05-31 NOTE — Telephone Encounter (Signed)
faxed

## 2022-05-31 NOTE — Telephone Encounter (Signed)
Copied from [place] [id number]. Topic: Medical Record Request - Provider/Facility Request >> May 31, 2022  9:31 AM Everette C wrote: Reason for CRM: Lauren with [facility] has called to request the sleep study be faxed to them at [phone]  Please contact further if needed

## 2022-08-01 ENCOUNTER — Other Ambulatory Visit: Payer: Self-pay

## 2022-08-01 ENCOUNTER — Encounter: Payer: Self-pay | Admitting: *Deleted

## 2022-08-01 ENCOUNTER — Encounter: Payer: Self-pay | Admitting: Family Medicine

## 2022-08-01 DIAGNOSIS — H1032 Unspecified acute conjunctivitis, left eye: Secondary | ICD-10-CM | POA: Diagnosis not present

## 2022-08-01 DIAGNOSIS — Z79899 Other long term (current) drug therapy: Secondary | ICD-10-CM | POA: Diagnosis not present

## 2022-08-01 DIAGNOSIS — H5712 Ocular pain, left eye: Secondary | ICD-10-CM | POA: Diagnosis present

## 2022-08-01 DIAGNOSIS — I10 Essential (primary) hypertension: Secondary | ICD-10-CM | POA: Diagnosis not present

## 2022-08-01 NOTE — ED Notes (Signed)
Visual acuity 20/20 both eyes 

## 2022-08-01 NOTE — ED Triage Notes (Signed)
Pt has pain in left eye since yesterday.  No known injury.  Left eye red and itchy.  Pt states it feels like something is in it.  Pt alert.

## 2022-08-02 ENCOUNTER — Telehealth: Payer: Self-pay

## 2022-08-02 ENCOUNTER — Emergency Department
Admission: EM | Admit: 2022-08-02 | Discharge: 2022-08-02 | Disposition: A | Discharge status: Home / Self Care | Payer: BC Managed Care – PPO | Attending: Emergency Medicine | Admitting: Emergency Medicine

## 2022-08-02 DIAGNOSIS — H5712 Ocular pain, left eye: Secondary | ICD-10-CM

## 2022-08-02 DIAGNOSIS — H109 Unspecified conjunctivitis: Secondary | ICD-10-CM

## 2022-08-02 MED ORDER — TOBRAMYCIN 0.3 % OP SOLN
2.0000 [drp] | OPHTHALMIC | 0 refills | Status: AC
Start: 2022-08-02 — End: 2022-08-09

## 2022-08-02 MED ORDER — TOBRAMYCIN 0.3 % OP SOLN
2.0000 [drp] | OPHTHALMIC | Status: DC
  Administered 2022-08-02: 2 [drp] via OPHTHALMIC
  Filled 2022-08-02: qty 5

## 2022-08-02 MED ORDER — TETRACAINE HCL 0.5 % OP SOLN
2.0000 [drp] | Freq: Once | OPHTHALMIC | Status: AC
  Administered 2022-08-02: 2 [drp] via OPHTHALMIC
  Filled 2022-08-02: qty 4

## 2022-08-02 MED ORDER — FLUORESCEIN SODIUM 1 MG OP STRP
1.0000 | ORAL_STRIP | Freq: Once | OPHTHALMIC | Status: AC
  Administered 2022-08-02: 1 via OPHTHALMIC
  Filled 2022-08-02: qty 1

## 2022-08-02 NOTE — Discharge Instructions (Signed)
Apply antibiotic eyedrops 2 drops to left eye every 4 days.  Return to the ER for worsening symptoms, persistent vomiting, difficulty breathing or other concerns.

## 2022-08-02 NOTE — ED Provider Notes (Signed)
[facility] Provider Note    Event Date/Time   First MD Initiated Contact with Patient 08/02/22 0158     (approximate)   History   Eye Problem   HPI  Ian Romero is a 37 y.o. male  who presents to the ED from home with a chief complaint of left eye pain and irritation since yesterday.  No known injury.  Reports left eye is red and itchy and patient states it feels like something is in it.  Denies fever/chills, cough, chest pain, shortness of breath, abdominal pain, nausea or vomiting.  Wears glasses as needed.      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 Authorizing Provider  tobramycin (TOBREX) 0.3 % ophthalmic solution Place 2 drops into the left eye every 4 (four) hours for 7 days. 08/02/22 08/09/22 Yes Paulette Blanch, MD  amLODipine (NORVASC) 10 MG tablet Take 1 tablet (10 mg total) by mouth daily. 05/22/22   Montel Culver, MD  celecoxib (CELEBREX) 400 MG capsule Take 1 capsule (400 mg total) by mouth daily as needed. 05/22/22    Montel Culver, MD  levocetirizine (XYZAL) 5 MG tablet Take 5 mg by mouth every evening.    [provider]  lisinopril-hydrochlorothiazide (ZESTORETIC) 20-25 MG tablet Take 1 tablet by mouth daily. 05/22/22   Montel Culver, MD  methocarbamol (ROBAXIN) 750 MG tablet TAKE 1 TABLET (750 MG TOTAL) BY MOUTH EVERY 8 (EIGHT) HOURS AS NEEDED FOR MUSCLE SPASMS. 04/24/22   Montel Culver, MD  mometasone (NASONEX) 50 MCG/ACT nasal spray One spray in each nostril twice a day, use left hand for right nostril, and right hand for left nostril.  Please dispense one bottle. 05/22/22   Montel Culver, MD  sertraline (ZOLOFT) 50 MG tablet TAKE 1 TABLET BY MOUTH EVERY DAY 05/15/22   Montel Culver, MD     Allergies  Patient has no known allergies.   Family History   Family History  Problem Relation Age of Onset   Allergies Mother    Allergies Daughter    Allergies Son    Allergies Son    Hypertension Maternal Grandmother    Diabetes Maternal Grandmother    Diabetes Paternal Grandmother    Asthma Paternal Grandfather    Stroke Paternal Grandfather      Physical Exam  Triage Vital Signs: ED Triage Vitals  Enc Vitals Group     BP 08/01/22 2235 (!) 159/100  Pulse Rate 08/01/22 2235 68     Resp 08/01/22 2235 18     Temp 08/01/22 2235 98.4 F (36.9 C)     Temp Source 08/01/22 2235 Oral     SpO2 08/01/22 2235 95 %     Weight 08/01/22 2236 255 lb (115.7 kg)     Height 08/01/22 2236 5\' 10"  (1.778 m)     Head Circumference --      Peak Flow --      Pain Score 08/01/22 2236 10     Pain Loc --      Pain Edu? --      Excl. in [place]? --     Updated Vital Signs: BP (!) 160/93 (BP Location: Right Arm)   Pulse 72   Temp 98.4 F (36.9 C) (Oral)   Resp 17   Ht 5\' 10"  (1.778 m)   Wt 115.7 kg   SpO2 100%   BMI 36.59 kg/m    General: Awake, no distress.  CV:  Good peripheral perfusion.  Resp:  Normal effort.  Abd:  No distention.  Other:  VA noted 20/20 both eyes.   Patient was not wearing glasses at the time.  PERRL.  EOMI.  Left eye: Yellow exudate noted with matting.  Conjunctival injection.  Eyelid everted: No foreign body noted.  Tetracaine applied, stained with fluorescein and examined under Woods lamp: No COA.   ED Results / Procedures / Treatments  Labs (all labs ordered are listed, but only abnormal results are displayed) Labs Reviewed - No data to display   EKG  None   RADIOLOGY None   Official radiology report(s): No results found.   PROCEDURES:  Critical Care performed: No  Procedures   MEDICATIONS ORDERED IN ED: Medications  tobramycin (TOBREX) 0.3 % ophthalmic solution 2 drop (2 drops Left Eye Given 08/02/22 0439)  tetracaine (PONTOCAINE) 0.5 % ophthalmic solution 2 drop (2 drops Left Eye Given by Other 08/02/22 0320)  fluorescein ophthalmic strip 1 strip (1 strip Left Eye Given by Other 08/02/22 0321)     IMPRESSION / MDM / ASSESSMENT AND PLAN / ED COURSE  I reviewed the triage vital signs and the nursing notes.                             38 year old male presenting with left eye irritation.  Differential diagnosis includes but is not limited to conjunctivitis due to allergic versus viral versus bacterial, tiny foreign body not visible on exam.  Will empirically treat with Tobrex eyedrops.  Will irrigate with Lilia Pro lens and patient will follow-up with ophthalmology..  Strict return precautions given.  Patient verbalizes understanding and agrees with plan of care.  Patient's presentation is most consistent with acute, uncomplicated illness.   FINAL CLINICAL IMPRESSION(S) / ED DIAGNOSES   Final diagnoses:  Conjunctivitis of left eye, unspecified conjunctivitis type  Left eye pain     Rx / DC Orders   ED Discharge Orders          Ordered    tobramycin (TOBREX) 0.3 % ophthalmic solution  Every 4 hours        08/02/22 0327             Note:  This document was prepared using Dragon voice recognition  software and may include unintentional dictation errors.   Paulette Blanch, MD 08/02/22 [phone]

## 2022-08-02 NOTE — ED Notes (Signed)
Eye irrigation completed at this time.  1L of NS and morgan lens used to irrigate pts left eye.  Pt tolerated well.  Pt given DC instructions and return precautions.  All questions answered at this time.  Pt ambulatory to lobby.

## 2022-08-02 NOTE — Transitions of Care (Post Inpatient/ED Visit) (Signed)
   08/02/2022  Name: Ian Romero MRN: [id number] DOB: 04/10/1986  Today's TOC FU Call Status: Today's TOC FU Call Status:: Unsuccessul Call (1st Attempt) Unsuccessful Call (1st Attempt) Date: 08/02/22  Attempted to reach the patient regarding the most recent Inpatient/ED visit.  Follow Up Plan: Additional outreach attempts will be made to reach the patient to complete the Transitions of Care (Post Inpatient/ED visit) call.   Signature Steffanie Dunn Inda Merlin

## 2022-08-03 NOTE — Transitions of Care (Post Inpatient/ED Visit) (Unsigned)
   08/03/2022  Name: Ian Romero MRN: [id number] DOB: 01-14-1986  Today's TOC FU Call Status: Today's TOC FU Call Status:: Unsuccessful Call (2nd Attempt) Unsuccessful Call (1st Attempt) Date: 08/02/22 Unsuccessful Call (2nd Attempt) Date: 08/03/22  Attempted to reach the patient regarding the most recent Inpatient/ED visit.  Follow Up Plan: Additional outreach attempts will be made to reach the patient to complete the Transitions of Care (Post Inpatient/ED visit) call.   Signature Steffanie Dunn Inda Merlin

## 2022-08-06 NOTE — Transitions of Care (Post Inpatient/ED Visit) (Signed)
   08/06/2022  Name: Ian Romero MRN: [id number] DOB: Apr 08, 1986  Today's TOC FU Call Status: Today's TOC FU Call Status:: Unsuccessful Call (3rd Attempt) Unsuccessful Call (1st Attempt) Date: 08/02/22 Unsuccessful Call (2nd Attempt) Date: 08/03/22 Unsuccessful Call (3rd Attempt) Date: 08/06/22  Attempted to reach the patient regarding the most recent Inpatient/ED visit.  Follow Up Plan: No further outreach attempts will be made at this time. We have been unable to contact the patient.  Signature Steffanie Dunn Inda Merlin

## 2022-08-08 ENCOUNTER — Encounter: Payer: BC Managed Care – PPO | Admitting: Family Medicine

## 2022-08-10 ENCOUNTER — Ambulatory Visit: Payer: BC Managed Care – PPO | Admitting: Family Medicine

## 2022-08-12 ENCOUNTER — Other Ambulatory Visit: Payer: Self-pay | Admitting: Family Medicine

## 2022-08-12 DIAGNOSIS — F411 Generalized anxiety disorder: Secondary | ICD-10-CM

## 2022-08-13 NOTE — Telephone Encounter (Signed)
Requested medications are due for refill today.  yes  Requested medications are on the active medications list.  yes  Last refill. 05/15/2022 #90 0 rf  Future visit scheduled.   yes  Notes to clinic.  Labs are expired.    Requested Prescriptions  Pending Prescriptions Disp Refills   sertraline (ZOLOFT) 50 MG tablet [Pharmacy Med Name: SERTRALINE HCL 50 MG TABLET] 90 tablet 0    Sig: TAKE 1 TABLET BY MOUTH EVERY DAY     Psychiatry:  Antidepressants - SSRI - sertraline Failed - 08/12/2022  8:52 AM      Failed - AST in normal range and within 360 days    AST  Date Value Ref Range Status  02/22/2021 24 0 - 40 IU/L Final         Failed - ALT in normal range and within 360 days    ALT  Date Value Ref Range Status  02/22/2021 29 0 - 44 IU/L Final         Passed - Completed PHQ-2 or PHQ-9 in the last 360 days      Passed - Valid encounter within last 6 months    Recent Outpatient Visits           2 months ago OSA (obstructive sleep apnea)    Primary Care & Sports Medicine at [place], Earley Abide, MD   5 months ago Hypertension, unspecified type   [facility] Primary [place] at [place], Earley Abide, MD   7 months ago Hypertension, unspecified type   [facility] Primary [place] at [place], Earley Abide, MD   9 months ago Hypertension, unspecified type   [facility] Primary [place] at [facility], Earley Abide, MD   10 months ago Thoracolumbar back pain   [place] at [facility], Earley Abide, MD       Future Appointments             In 1 month Zigmund Daniel, Earley Abide, MD [place] at [facility], [facility]

## 2022-08-23 ENCOUNTER — Other Ambulatory Visit: Payer: Self-pay | Admitting: Family Medicine

## 2022-08-23 DIAGNOSIS — I10 Essential (primary) hypertension: Secondary | ICD-10-CM

## 2022-08-23 DIAGNOSIS — M47817 Spondylosis without myelopathy or radiculopathy, lumbosacral region: Secondary | ICD-10-CM

## 2022-08-23 NOTE — Telephone Encounter (Signed)
Requested medication (s) are due for refill today: yes  Requested medication (s) are on the active medication list: yes  Last refill:  05/22/22 #90/0  Future visit scheduled: yes  Notes to clinic:  Unable to refill per protocol due to failed labs, no updated results.      Requested Prescriptions  Pending Prescriptions Disp Refills   lisinopril-hydrochlorothiazide (ZESTORETIC) 20-25 MG tablet [Pharmacy Med Name: LISINOPRIL-HCTZ 20-25 MG TAB] 90 tablet 0    Sig: TAKE 1 TABLET BY MOUTH EVERY DAY     Cardiovascular:  ACEI + Diuretic Combos Failed - 08/23/2022  1:42 AM      Failed - Na in normal range and within 180 days    Sodium  Date Value Ref Range Status  02/22/2021 140 134 - 144 mmol/L Final         Failed - K in normal range and within 180 days    Potassium  Date Value Ref Range Status  02/22/2021 4.5 3.5 - 5.2 mmol/L Final         Failed - Cr in normal range and within 180 days    Creatinine, Ser  Date Value Ref Range Status  02/22/2021 0.96 0.76 - 1.27 mg/dL Final         Failed - eGFR is 30 or above and within 180 days    GFR  Date Value Ref Range Status  04/07/2019 117.89 >60.00 mL/min Final   eGFR  Date Value Ref Range Status  02/22/2021 105 >59 mL/min/1.73 Final         Failed - Last BP in normal range    BP Readings from Last 1 Encounters:  08/02/22 (!) 160/93         Passed - Patient is not pregnant      Passed - Valid encounter within last 6 months    Recent Outpatient Visits           3 months ago OSA (obstructive sleep apnea)   [place] [facility] at [facility], Ocie Bob, MD   6 months ago Hypertension, unspecified type   [facility] Primary Care & Sports Medicine at [facility], Ocie Bob, MD   8 months ago Hypertension, unspecified type   [facility] Primary Care & Sports Medicine at [facility], Ocie Bob, MD   9 months ago Hypertension, unspecified type   [facility] Primary  Care & Sports Medicine at [facility], Ocie Bob, MD   10 months ago Thoracolumbar back pain   [facility] Primary Care & Sports Medicine at [facility], Ocie Bob, MD       Future Appointments             In 1 month Ashley Royalty Ocie Bob, MD [facility] Primary Care & Sports Medicine at [facility], [place]             celecoxib (CELEBREX) 400 MG capsule [Pharmacy Med Name: CELECOXIB 400 MG CAPSULE] 90 capsule 0    Sig: Take 1 capsule (400 mg total) by mouth daily as needed.     Analgesics:  COX2 Inhibitors Failed - 08/23/2022  1:42 AM      Failed - Manual Review: Labs are only required if the patient has taken medication for more than 8 weeks.      Failed - HGB in normal range and within 360 days    Hemoglobin  Date Value Ref Range Status  02/22/2021 14.6 13.0 - 17.7  g/dL Final         Failed - Cr in normal range and within 360 days    Creatinine, Ser  Date Value Ref Range Status  02/22/2021 0.96 0.76 - 1.27 mg/dL Final         Failed - HCT in normal range and within 360 days    Hematocrit  Date Value Ref Range Status  02/22/2021 44.3 37.5 - 51.0 % Final         Failed - AST in normal range and within 360 days    AST  Date Value Ref Range Status  02/22/2021 24 0 - 40 IU/L Final         Failed - ALT in normal range and within 360 days    ALT  Date Value Ref Range Status  02/22/2021 29 0 - 44 IU/L Final         Failed - eGFR is 30 or above and within 360 days    GFR  Date Value Ref Range Status  04/07/2019 117.89 >60.00 mL/min Final   eGFR  Date Value Ref Range Status  02/22/2021 105 >59 mL/min/1.73 Final         Passed - Patient is not pregnant      Passed - Valid encounter within last 12 months    Recent Outpatient Visits           3 months ago OSA (obstructive sleep apnea)   [place] Primary Care & Sports Medicine at [facility], Ocie Bob, MD   6 months ago Hypertension, unspecified type   [facility]  Primary Care & Sports Medicine at [facility], Ocie Bob, MD   8 months ago Hypertension, unspecified type   [facility] Primary Care & Sports Medicine at [facility], Ocie Bob, MD   9 months ago Hypertension, unspecified type   [facility] Primary Care & Sports Medicine at [facility], Ocie Bob, MD   10 months ago Thoracolumbar back pain   [facility] Primary Care & Sports Medicine at [facility], Ocie Bob, MD       Future Appointments             In 1 month Ashley Royalty, Ocie Bob, MD [facility], [facility] Primary Care & Sports Medicine at [facility], [facility]

## 2022-09-06 ENCOUNTER — Other Ambulatory Visit: Payer: Self-pay | Admitting: Family Medicine

## 2022-09-06 DIAGNOSIS — I10 Essential (primary) hypertension: Secondary | ICD-10-CM

## 2022-09-06 NOTE — Telephone Encounter (Signed)
Requested Prescriptions  Pending Prescriptions Disp Refills   lisinopril-hydrochlorothiazide (ZESTORETIC) 20-25 MG tablet [Pharmacy Med Name: LISINOPRIL-HCTZ 20-25 MG TAB] 90 tablet 0    Sig: TAKE 1 TABLET BY MOUTH EVERY DAY     Cardiovascular:  ACEI + Diuretic Combos Failed - 09/06/2022  9:03 AM      Failed - Na in normal range and within 180 days    Sodium  Date Value Ref Range Status  02/22/2021 140 134 - 144 mmol/L Final         Failed - K in normal range and within 180 days    Potassium  Date Value Ref Range Status  02/22/2021 4.5 3.5 - 5.2 mmol/L Final         Failed - Cr in normal range and within 180 days    Creatinine, Ser  Date Value Ref Range Status  02/22/2021 0.96 0.76 - 1.27 mg/dL Final         Failed - eGFR is 30 or above and within 180 days    GFR  Date Value Ref Range Status  04/07/2019 117.89 >60.00 mL/min Final   eGFR  Date Value Ref Range Status  02/22/2021 105 >59 mL/min/1.73 Final         Failed - Last BP in normal range    BP Readings from Last 1 Encounters:  08/02/22 (!) 160/93         Passed - Patient is not pregnant      Passed - Valid encounter within last 6 months    Recent Outpatient Visits           3 months ago OSA (obstructive sleep apnea)   [place] Primary Care & Sports Medicine at [facility], Ocie Bob, MD   6 months ago Hypertension, unspecified type   [facility] Primary Care & Sports Medicine at [facility], Ocie Bob, MD   8 months ago Hypertension, unspecified type   [facility] Primary Care & Sports Medicine at [facility], Ocie Bob, MD   10 months ago Hypertension, unspecified type   [facility] Primary Care & Sports Medicine at [facility], Ocie Bob, MD   10 months ago Thoracolumbar back pain   [facility] Primary Care & Sports Medicine at [facility], Ocie Bob, MD       Future Appointments             In 2 weeks Ashley Royalty, Ocie Bob, MD [facility]  Primary Care & Sports Medicine at [facility], [facility]

## 2022-09-24 ENCOUNTER — Encounter: Payer: BC Managed Care – PPO | Admitting: Family Medicine

## 2022-11-11 ENCOUNTER — Other Ambulatory Visit: Payer: Self-pay | Admitting: Family Medicine

## 2022-11-11 DIAGNOSIS — F411 Generalized anxiety disorder: Secondary | ICD-10-CM

## 2022-11-13 NOTE — Telephone Encounter (Signed)
Requested medication (s) are due for refill today: yes  Requested medication (s) are on the active medication list:yes  Last refill:  08/13/22 #90  Future visit scheduled: no  Notes to clinic:  overdue lab work    Requested Prescriptions  Pending Prescriptions Disp Refills   sertraline (ZOLOFT) 50 MG tablet [Pharmacy Med Name: SERTRALINE HCL 50 MG TABLET] 90 tablet 0    Sig: TAKE 1 TABLET BY MOUTH EVERY DAY     Psychiatry:  Antidepressants - SSRI - sertraline Failed - 11/11/2022  8:48 AM      Failed - AST in normal range and within 360 days    AST  Date Value Ref Range Status  02/22/2021 24 0 - 40 IU/L Final         Failed - ALT in normal range and within 360 days    ALT  Date Value Ref Range Status  02/22/2021 29 0 - 44 IU/L Final         Passed - Completed PHQ-2 or PHQ-9 in the last 360 days      Passed - Valid encounter within last 6 months    Recent Outpatient Visits           5 months ago OSA (obstructive sleep apnea)    [facility] at [facility] Ashley Royalty, Ocie Bob, MD   8 months ago Hypertension, unspecified type   [facility] Primary Care & Sports Medicine at [facility], Ocie Bob, MD   10 months ago Hypertension, unspecified type   [facility] Primary Care & Sports Medicine at [facility], Ocie Bob, MD   1 year ago Hypertension, unspecified type   [facility] Primary Care & Sports Medicine at [facility], Ocie Bob, MD   1 year ago Thoracolumbar back pain   [facility] Primary Care & Sports Medicine at [facility], Ocie Bob, MD

## 2022-11-20 ENCOUNTER — Other Ambulatory Visit: Payer: Self-pay | Admitting: Family Medicine

## 2022-11-20 DIAGNOSIS — M47817 Spondylosis without myelopathy or radiculopathy, lumbosacral region: Secondary | ICD-10-CM

## 2022-11-20 NOTE — Telephone Encounter (Signed)
Please review.  KP

## 2022-11-21 MED ORDER — METHOCARBAMOL 750 MG PO TABS: 750.0000 mg | ORAL_TABLET | Freq: Three times a day (TID) | ORAL | 0 refills | Status: DC | PRN

## 2022-12-12 ENCOUNTER — Other Ambulatory Visit: Payer: Self-pay | Admitting: Family Medicine

## 2022-12-13 NOTE — Telephone Encounter (Signed)
Requested medication (s) are due for refill today: yes  Requested medication (s) are on the active medication list: yes  Last refill:  11/21/22  Future visit scheduled: no  Notes to clinic:  Unable to refill per protocol, cannot delegate.      Requested Prescriptions  Pending Prescriptions Disp Refills   methocarbamol (ROBAXIN) 750 MG tablet [Pharmacy Med Name: METHOCARBAMOL 750 MG TABLET] 60 tablet 0    Sig: Take 1 tablet (750 mg total) by mouth every 8 (eight) hours as needed for muscle spasms.     Not Delegated - Analgesics:  Muscle Relaxants Failed - 12/12/2022  3:00 PM      Failed - This refill cannot be delegated      Failed - Valid encounter within last 6 months    Recent Outpatient Visits           6 months ago OSA (obstructive sleep apnea)   [facility], Ocie Bob, MD   9 months ago Hypertension, unspecified type   [facility] at [facility], Ocie Bob, MD   11 months ago Hypertension, unspecified type   [facility] at [facility], Ocie Bob, MD   1 year ago Hypertension, unspecified type   [facility] & [facility] Medicine at [facility], Ocie Bob, MD   1 year ago Thoracolumbar back pain   [facility] at [facility], Ocie Bob, MD

## 2023-01-16 ENCOUNTER — Other Ambulatory Visit: Payer: Self-pay | Admitting: Family Medicine

## 2023-01-17 NOTE — Telephone Encounter (Signed)
Requested medication (s) are due for refill today - yes  Requested medication (s) are on the active medication list -yes  Future visit scheduled -no  Last refill: 11/21/22 #60  Notes to clinic: non delegated Rx  Requested Prescriptions  Pending Prescriptions Disp Refills   methocarbamol (ROBAXIN) 750 MG tablet [Pharmacy Med Name: METHOCARBAMOL 750 MG TABLET] 60 tablet 0    Sig: Take 1 tablet (750 mg total) by mouth every 8 (eight) hours as needed for muscle spasms.     Not Delegated - Analgesics:  Muscle Relaxants Failed - 01/16/2023  1:12 PM      Failed - This refill cannot be delegated      Failed - Valid encounter within last 6 months    Recent Outpatient Visits           8 months ago OSA (obstructive sleep apnea)   [place] [facility], Ocie Bob, MD   11 months ago Hypertension, unspecified type   [facility] at [facility], Ocie Bob, MD   1 year ago Hypertension, unspecified type   [facility] & [facility] at [facility], Ocie Bob, MD   1 year ago Hypertension, unspecified type   [facility] at [facility], Ocie Bob, MD   1 year ago Thoracolumbar back pain   [place] [facility] at [facility], Ocie Bob, MD                 Requested Prescriptions  Pending Prescriptions Disp Refills   methocarbamol (ROBAXIN) 750 MG tablet [Pharmacy Med Name: METHOCARBAMOL 750 MG TABLET] 60 tablet 0    Sig: Take 1 tablet (750 mg total) by mouth every 8 (eight) hours as needed for muscle spasms.     Not Delegated - Analgesics:  Muscle Relaxants Failed - 01/16/2023  1:12 PM      Failed - This refill cannot be delegated      Failed - Valid encounter within last 6 months    Recent Outpatient Visits           8 months ago OSA (obstructive sleep apnea)   [place] [facility], Ocie Bob, MD   11 months ago Hypertension, unspecified type   [facility] at [facility], Ocie Bob, MD   1 year ago Hypertension, unspecified type   [facility] at [facility], Ocie Bob, MD   1 year ago Hypertension, unspecified type   [facility] at [facility], Ocie Bob, MD   1 year ago Thoracolumbar back pain   [facility] at [facility], Ocie Bob, MD

## 2023-04-19 ENCOUNTER — Ambulatory Visit: Payer: BC Managed Care – PPO | Admitting: Family Medicine

## 2023-05-06 ENCOUNTER — Ambulatory Visit: Payer: BC Managed Care – PPO | Admitting: Family Medicine

## 2023-05-21 ENCOUNTER — Ambulatory Visit: Payer: 59 | Admitting: Family Medicine

## 2023-05-21 VITALS — BP 148/98 | HR 77 | Ht 70.0 in | Wt 263.8 lb

## 2023-05-21 DIAGNOSIS — M47815 Spondylosis without myelopathy or radiculopathy, thoracolumbar region: Secondary | ICD-10-CM | POA: Diagnosis not present

## 2023-05-21 DIAGNOSIS — J302 Other seasonal allergic rhinitis: Secondary | ICD-10-CM | POA: Insufficient documentation

## 2023-05-21 DIAGNOSIS — M47817 Spondylosis without myelopathy or radiculopathy, lumbosacral region: Secondary | ICD-10-CM

## 2023-05-21 DIAGNOSIS — I10 Essential (primary) hypertension: Secondary | ICD-10-CM | POA: Diagnosis not present

## 2023-05-21 MED ORDER — AMLODIPINE BESYLATE 10 MG PO TABS: 10.0000 mg | ORAL_TABLET | Freq: Every day | ORAL | 0 refills | Status: DC

## 2023-05-21 MED ORDER — METHYLPREDNISOLONE 4 MG PO TBPK: ORAL_TABLET | ORAL | 0 refills | Status: DC

## 2023-05-21 MED ORDER — LISINOPRIL-HYDROCHLOROTHIAZIDE 20-25 MG PO TABS: 1.0000 | ORAL_TABLET | Freq: Every day | ORAL | 0 refills | Status: DC

## 2023-05-21 MED ORDER — PROMETHAZINE-DM 6.25-15 MG/5ML PO SYRP: 5.0000 mL | ORAL_SOLUTION | Freq: Four times a day (QID) | ORAL | 0 refills | Status: DC | PRN

## 2023-05-21 MED ORDER — CELECOXIB 400 MG PO CAPS: 400.0000 mg | ORAL_CAPSULE | Freq: Every day | ORAL | 0 refills | Status: DC | PRN

## 2023-05-21 NOTE — Progress Notes (Signed)
 Primary Care / Sports Medicine Office Visit  Patient Information:  Patient ID: Ian Romero, male DOB: [birth year] Age: 38 y.o. MRN: [id number]   Ian Romero is a pleasant 38 y.o. male presenting with the following:  Chief Complaint  Patient presents with   Cough    Constant cough x 5 days, difficulty swallowing, wit low back pain.    Vitals:   05/21/23 1322 05/21/23 1328  BP: (!) 150/100 (!) 148/98  Pulse: 77   SpO2: 96%    Vitals:   05/21/23 1322  Weight: 263 lb 12.8 oz (119.7 kg)  Height: 5' 10 (1.778 m)   Body mass index is 37.85 kg/m.  No results found.   Independent interpretation of notes and tests performed by another provider:   None  Procedures performed:   None  Pertinent History, Exam, Impression, and Recommendations:   Problem List Items Addressed This Visit     Hypertension   Hypertension Elevated blood pressure noted during visit, patient reported not taking medication regularly. -Resume amlodipine  and lisinopril  as prescribed. -Check blood pressure at next visit. -Close follow-up for CPE.      Relevant Medications   amLODipine  (NORVASC ) 10 MG tablet   lisinopril -hydrochlorothiazide  (ZESTORETIC ) 20-25 MG tablet   Seasonal allergic rhinitis - Primary   Presents with an intermittent nonproductive cough for five days, throat discomfort, and ear discomfort. Initially in the right ear and later in the left ear. The discomfort in the ears is associated with swallowing and has been intermittent. Ian Romero also reports nasal congestion, which he believes may be due to sinus issues. This has been affecting his sleep, as he uses a CPAP machine. He has been using Nasonex , but reports that it irritates the inside of his nose.  Physical Exam VITALS: BP- 148/98, SaO2- 96% HEENT: Left tympanic membrane obstructed by light-colored cerumen, right tympanic membrane visible and benign.  Nails benign bilaterally.  Nasal turbinates slightly red and swollen.   Nontender maxillary, ethmoid, and frontal sinuses.  Oropharynx benign. CHEST: Lungs clear to auscultation bilaterally with equal air entry, no wheezes, rales, rhonchi.  Rhinitis Recent onset of cough, dysphagia, and otalgia, most likely linked to known history of seasonal allergic rhinitis symptoms. No overt signs of severe infection at this time. -Start oral steroids (Medrol  dose pack) for inflammation. -Start Flonase  Sensimist for nasal inflammation. -Continue over-the-counter antihistamine (Xyzal ) and Mucinex for mucus thinning. -Consider antibiotics if no improvement or worsening symptoms by next week.  Patient to contact us  via MyChart message.      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 The back pain has been ongoing, exacerbated by physical activities at work and at home, including helping his father-in-law move and moving a dresser upstairs over the past few weeks. Despite the discomfort, Ian Romero has not been taking any over-the-counter pain medications. The pain is localized to the low back and extends to the buttocks, but does not involve any pins, needles, numbness, or tingling in the legs.  Thoracolumbar back Pain Chronic low back pain with recent exacerbation, linked to increased physical activity at work. Pain radiates to the buttocks that neurological symptoms reported. -  Patient will be placed on Medrol  Dosepak for this and comorbid rhinitis. -Following Medrol  Dosepak, he is to start as needed Celebrex  coupled with supportive care. -Continue current management and  monitor. -Will follow-up on this issue at his return for CPE.      Relevant Medications   methylPREDNISolone  (MEDROL  DOSEPAK) 4 MG TBPK tablet   celecoxib  (CELEBREX ) 400 MG capsule     Orders & Medications Medications:  Meds ordered this encounter  Medications   methylPREDNISolone  (MEDROL  DOSEPAK) 4 MG TBPK tablet    Sig: Take for full course per package instructions    Dispense:  21 tablet    Refill:  0   amLODipine  (NORVASC ) 10 MG tablet    Sig: Take 1 tablet (10 mg total) by mouth daily.    Dispense:  90 tablet    Refill:  0   lisinopril -hydrochlorothiazide  (ZESTORETIC ) 20-25 MG tablet    Sig: Take 1 tablet by mouth daily.    Dispense:  90 tablet    Refill:  0   celecoxib  (CELEBREX ) 400 MG capsule    Sig: Take 1 capsule (400 mg total) by mouth daily as needed.    Dispense:  30 capsule    Refill:  0   promethazine -dextromethorphan (PROMETHAZINE -DM) 6.25-15 MG/5ML syrup    Sig: Take 5 mLs by mouth 4 (four) times daily as needed for cough.    Dispense:  118 mL    Refill:  0   No orders of the defined types were placed in this encounter.    Return in about 1 month (around 06/21/2023) for 1-2 months , CPE.     Selinda [name], MD, [facility]   Primary Care Sports Medicine Primary Care and Sports Medicine at [facility]

## 2023-05-21 NOTE — Assessment & Plan Note (Signed)
 Hypertension Elevated blood pressure noted during visit, patient reported not taking medication regularly. -Resume amlodipine and lisinopril as prescribed. -Check blood pressure at next visit. -Close follow-up for CPE.

## 2023-05-21 NOTE — Assessment & Plan Note (Signed)
 Presents with an intermittent nonproductive cough for five days, throat discomfort, and ear discomfort. Initially in the right ear and later in the left ear. The discomfort in the ears is associated with swallowing and has been intermittent. Ian Romero also reports nasal congestion, which he believes may be due to sinus issues. This has been affecting his sleep, as he uses a CPAP machine. He has been using Nasonex , but reports that it irritates the inside of his nose.  Physical Exam VITALS: BP- 148/98, SaO2- 96% HEENT: Left tympanic membrane obstructed by light-colored cerumen, right tympanic membrane visible and benign.  Nails benign bilaterally.  Nasal turbinates slightly red and swollen.  Nontender maxillary, ethmoid, and frontal sinuses.  Oropharynx benign. CHEST: Lungs clear to auscultation bilaterally with equal air entry, no wheezes, rales, rhonchi.  Rhinitis Recent onset of cough, dysphagia, and otalgia, most likely linked to known history of seasonal allergic rhinitis symptoms. No overt signs of severe infection at this time. -Start oral steroids (Medrol  dose pack) for inflammation. -Start Flonase  Sensimist for nasal inflammation. -Continue over-the-counter antihistamine (Xyzal ) and Mucinex for mucus thinning. -Consider antibiotics if no improvement or worsening symptoms by next week.  Patient to contact us  via MyChart message.

## 2023-05-21 NOTE — Patient Instructions (Addendum)
-  Start oral steroids (Medrol  dose pack) for inflammation. -Start Flonase  Sensimist for nasal inflammation. -Continue over-the-counter antihistamine (Xyzal ) and Mucinex for mucus thinning. -Consider antibiotics if no improvement or worsening symptoms by next week.  Contact us  via MyChart message. -After steroids, dose Celebrex  daily as-needed (take with food) -Return in 1-2 months for annual physical

## 2023-05-21 NOTE — Assessment & Plan Note (Signed)
 Ian Romero, a mudlogger, presents with low back pain. The back pain has been ongoing, exacerbated by physical activities at work and at home, including helping his father-in-law move and moving a dresser upstairs over the past few weeks. Despite the discomfort, Ian Romero has not been taking any over-the-counter pain medications. The pain is localized to the low back and extends to the buttocks, but does not involve any pins, needles, numbness, or tingling in the legs.  Thoracolumbar back Pain Chronic low back pain with recent exacerbation, linked to increased physical activity at work. Pain radiates to the buttocks that neurological symptoms reported. -Patient will be placed on Medrol  Dosepak for this and comorbid rhinitis. -Following Medrol  Dosepak, he is to start as needed Celebrex  coupled with supportive care. -Continue current management and monitor. -Will follow-up on this issue at his return for CPE.

## 2023-05-24 ENCOUNTER — Other Ambulatory Visit: Payer: Self-pay | Admitting: Family Medicine

## 2023-05-24 ENCOUNTER — Encounter: Payer: Self-pay | Admitting: Family Medicine

## 2023-05-24 MED ORDER — AZITHROMYCIN 250 MG PO TABS: ORAL_TABLET | ORAL | 0 refills | Status: AC

## 2023-05-24 NOTE — Telephone Encounter (Signed)
**Note De-identified  [place] Obfuscation** Please advise 

## 2023-05-29 ENCOUNTER — Other Ambulatory Visit: Payer: Self-pay | Admitting: Family Medicine

## 2023-05-29 DIAGNOSIS — J302 Other seasonal allergic rhinitis: Secondary | ICD-10-CM

## 2023-05-29 NOTE — Telephone Encounter (Signed)
 Please review.  KP

## 2023-06-03 MED ORDER — PROMETHAZINE-DM 6.25-15 MG/5ML PO SYRP: 5.0000 mL | ORAL_SOLUTION | Freq: Four times a day (QID) | ORAL | 0 refills | Status: DC | PRN

## 2023-06-13 ENCOUNTER — Encounter: Payer: Self-pay | Admitting: Family Medicine

## 2023-06-13 ENCOUNTER — Telehealth (INDEPENDENT_AMBULATORY_CARE_PROVIDER_SITE_OTHER): Payer: 59 | Admitting: Family Medicine

## 2023-06-13 VITALS — Ht 70.0 in | Wt 263.0 lb

## 2023-06-13 DIAGNOSIS — J111 Influenza due to unidentified influenza virus with other respiratory manifestations: Secondary | ICD-10-CM | POA: Insufficient documentation

## 2023-06-13 MED ORDER — PROMETHAZINE-DM 6.25-15 MG/5ML PO SYRP: 5.0000 mL | ORAL_SOLUTION | Freq: Four times a day (QID) | ORAL | 0 refills | Status: DC | PRN

## 2023-06-13 MED ORDER — AZELASTINE HCL 0.1 % NA SOLN: 2.0000 | Freq: Two times a day (BID) | NASAL | 0 refills | Status: DC

## 2023-06-13 NOTE — Progress Notes (Signed)
Primary Care / Sports Medicine Virtual Visit  Patient Information:  Patient ID: Ian Romero, male DOB: 04-01-1986 Age: 38 y.o. MRN: [id number]   Ian Romero is a pleasant 38 y.o. male presenting with the following:  Chief Complaint  Patient presents with   Cough    Review of Systems: No fevers, chills, night sweats, weight loss, chest pain, or shortness of breath.   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   azelastine (ASTELIN) 0.1 % nasal spray Place 2 sprays into both nostrils 2 (two) times daily. Use in each nostril as directed   celecoxib (CELEBREX) 400 MG capsule Take 1 capsule (400 mg total) by mouth daily as needed.   levocetirizine (XYZAL) 5 MG tablet Take 5 mg by mouth every evening.   lisinopril-hydrochlorothiazide (ZESTORETIC) 20-25 MG tablet Take 1 tablet by mouth daily.   promethazine-dextromethorphan (PROMETHAZINE-DM) 6.25-15 MG/5ML syrup Take 5 mLs by mouth 4 (four) times daily as needed for cough.   [DISCONTINUED] methylPREDNISolone (MEDROL DOSEPAK) 4 MG TBPK tablet Take for full course per package instructions   [DISCONTINUED]  promethazine-dextromethorphan (PROMETHAZINE-DM) 6.25-15 MG/5ML syrup Take 5 mLs by mouth 4 (four) times daily as needed for cough.   No facility-administered encounter medications on file as of 06/13/2023.   Past Surgical History:  Procedure Laterality Date   APPENDECTOMY     NECK SURGERY  2008/2009   C3-4 per patient    Virtual Visit via MyChart Video:   I connected with Ferd Glassing on 06/13/23 via MyChart Video and verified that I am speaking with the correct person using appropriate identifiers.   The limitations, risks, security and privacy concerns of performing an evaluation and management service by MyChart Video, including the higher likelihood of inaccurate diagnoses and treatments, and the availability of in person appointments were reviewed. The possible need of an additional face-to-face encounter for complete and high quality delivery of care was discussed. The patient was also made aware that there may be a patient responsible charge related to this service. The patient expressed understanding and wishes to proceed.  Provider location is in medical facility. Patient location is at their home, different from provider location. People involved in care of the patient during this telehealth encounter were myself, my nurse/medical assistant, and my front office/scheduling team member.  Objective findings:   General: Speaking full sentences, no audible heavy breathing. Sounds alert and appropriately interactive. Well-appearing. Face symmetric. Extraocular movements intact. Pupils equal and round. No nasal flaring or accessory muscle use visualized.  Independent interpretation of notes and tests performed by another provider:   None  Pertinent History, Exam, Impression, and Recommendations:   Problem  List Items Addressed This Visit     Influenza - Primary   History of Present Illness The patient presents with a worsening cough and flu-like symptoms. They are accompanied by  their one-year-old child who recently had a double ear infection and was diagnosed with flu and adenovirus earlier this week.  The patient has a persistent and worsening cough that disrupts their sleep, waking them up at night. They have been using previously prescribed promethazine-dextromethorphan at night, which provides temporary relief, but the cough returns once the medication wears off. Tessalon Perles was previously tried but was less effective than the syrup.  In addition to the cough, they experience flu-like symptoms, including body aches, particularly in the back, and a lack of appetite. They feel queasy and have abdominal discomfort, which they attribute to mucus drainage. Their diet has been minimal, primarily consisting of snacks.  Their one-year-old child was recently diagnosed with flu and adenovirus after presenting with a double ear infection. They have been in close contact with their child, which they believe may have contributed to their current symptoms.  They recall using Mucinex and Flonase in the past, which seemed to help reduce the severity of the cough temporarily. However, the cough has since worsened, possibly due to continued exposure to their child's illness.  No current fever or chills, but they note a particularly bad day yesterday with a lack of appetite and abdominal discomfort. Occasional reddish nasal discharge is noted, likely due to irritation.  Assessment and Plan Influenza-presumed Likely viral etiology given recent exposure to child with confirmed influenza and adenovirus. Symptoms include persistent cough, body aches, and decreased appetite. No indication of bacterial superinfection at this time. -Continue current promethazine-DM as needed, up to 6 doses per day. -Resume Mucinex and Flonase to help thin and reduce mucus production. -Add Astelin nasal spray twice daily. -Use saline nasal spray as needed to hydrate nasal passages and facilitate mucus  clearance. -Monitor for worsening symptoms or development of fever, chills, or breathing difficulties, which may indicate a secondary bacterial infection.  General Health Maintenance -Return to work on Monday. Work excuse note to be provided via Clinical cytogeneticist.      Relevant Medications   azelastine (ASTELIN) 0.1 % nasal spray   promethazine-dextromethorphan (PROMETHAZINE-DM) 6.25-15 MG/5ML syrup     Orders & Medications Medications:  Meds ordered this encounter  Medications   azelastine (ASTELIN) 0.1 % nasal spray    Sig: Place 2 sprays into both nostrils 2 (two) times daily. Use in each nostril as directed    Dispense:  30 mL    Refill:  0    Use generic Astelin   promethazine-dextromethorphan (PROMETHAZINE-DM) 6.25-15 MG/5ML syrup    Sig: Take 5 mLs by mouth 4 (four) times daily as needed for cough.    Dispense:  240 mL    Refill:  0   No orders of the defined types were placed in this encounter.    I discussed the above assessment and treatment plan with the patient. The patient was provided an opportunity to ask questions and all were answered. The patient agreed with the plan and demonstrated an understanding of the instructions.   The patient was advised to call back or seek an in-person evaluation if the symptoms worsen or if the condition fails to improve as anticipated.   I provided a total time of 30 minutes including both face-to-face and non-face-to-face time on 06/13/2023 inclusive of time utilized for medical chart review, information gathering, care coordination with  staff, and documentation completion.    Jerrol Banana, MD, [facility]   Primary Care Sports Medicine Primary Care and Sports Medicine at [facility]

## 2023-06-13 NOTE — Patient Instructions (Signed)
1. Continue promethazine-DM as needed, up to 6 doses per day. 2. Resume Mucinex and Flonase to help thin and reduce mucus production. 3. Add Astelin nasal spray twice daily. 4. Use saline nasal spray as needed to hydrate nasal passages and facilitate mucus clearance. 5. Monitor for worsening symptoms or development of fever, chills, or breathing difficulties, which may indicate a secondary bacterial infection.

## 2023-06-13 NOTE — Assessment & Plan Note (Signed)
History of Present Illness The patient presents with a worsening cough and flu-like symptoms. They are accompanied by their one-year-old child who recently had a double ear infection and was diagnosed with flu and adenovirus earlier this week.  The patient has a persistent and worsening cough that disrupts their sleep, waking them up at night. They have been using previously prescribed promethazine-dextromethorphan at night, which provides temporary relief, but the cough returns once the medication wears off. Tessalon Perles was previously tried but was less effective than the syrup.  In addition to the cough, they experience flu-like symptoms, including body aches, particularly in the back, and a lack of appetite. They feel queasy and have abdominal discomfort, which they attribute to mucus drainage. Their diet has been minimal, primarily consisting of snacks.  Their one-year-old child was recently diagnosed with flu and adenovirus after presenting with a double ear infection. They have been in close contact with their child, which they believe may have contributed to their current symptoms.  They recall using Mucinex and Flonase in the past, which seemed to help reduce the severity of the cough temporarily. However, the cough has since worsened, possibly due to continued exposure to their child's illness.  No current fever or chills, but they note a particularly bad day yesterday with a lack of appetite and abdominal discomfort. Occasional reddish nasal discharge is noted, likely due to irritation.  Assessment and Plan Influenza-presumed Likely viral etiology given recent exposure to child with confirmed influenza and adenovirus. Symptoms include persistent cough, body aches, and decreased appetite. No indication of bacterial superinfection at this time. -Continue current promethazine-DM as needed, up to 6 doses per day. -Resume Mucinex and Flonase to help thin and reduce mucus production. -Add  Astelin nasal spray twice daily. -Use saline nasal spray as needed to hydrate nasal passages and facilitate mucus clearance. -Monitor for worsening symptoms or development of fever, chills, or breathing difficulties, which may indicate a secondary bacterial infection.  General Health Maintenance -Return to work on Monday. Work excuse note to be provided via Clinical cytogeneticist.

## 2023-06-14 ENCOUNTER — Encounter: Payer: Self-pay | Admitting: Family Medicine

## 2023-07-05 ENCOUNTER — Other Ambulatory Visit: Payer: Self-pay | Admitting: Family Medicine

## 2023-07-05 DIAGNOSIS — J111 Influenza due to unidentified influenza virus with other respiratory manifestations: Secondary | ICD-10-CM

## 2023-07-08 NOTE — Telephone Encounter (Signed)
 Requested medication (s) are due for refill today: routing for review  Requested medication (s) are on the active medication list: yes  Last refill:  06/13/23  Future visit scheduled: yes  Notes to clinic:  Unable to refill per protocol, should patient continue to take, last refilled for short supply.      Requested Prescriptions  Pending Prescriptions Disp Refills   Azelastine HCl 137 MCG/SPRAY SOLN [Pharmacy Med Name: AZELASTINE 0.1% (137 MCG) SPRY]  1    Sig: PLACE 2 SPRAYS INTO BOTH NOSTRILS 2 (TWO) TIMES DAILY. USE IN EACH NOSTRIL AS DIRECTED     Ear, Nose, and Throat: Nasal Preparations - Antiallergy Passed - 07/08/2023  9:30 AM      Passed - Valid encounter within last 12 months    Recent Outpatient Visits           3 weeks ago Influenza   [facility], Ocie Bob, MD   1 month ago Seasonal allergic rhinitis, unspecified trigger   [facility] at [facility], Ocie Bob, MD   1 year ago OSA (obstructive sleep apnea)   [facility] at [facility], Ocie Bob, MD   1 year ago Hypertension, unspecified type   [facility] at [facility], Ocie Bob, MD   1 year ago Hypertension, unspecified type   [facility] at [facility], Ocie Bob, MD       Future Appointments             In 3 weeks Ashley Royalty, Ocie Bob, MD [facility] at [facility], [facility]

## 2023-08-02 ENCOUNTER — Encounter: Payer: Self-pay | Admitting: Family Medicine

## 2023-08-16 ENCOUNTER — Other Ambulatory Visit: Payer: Self-pay | Admitting: Family Medicine

## 2023-08-16 DIAGNOSIS — I10 Essential (primary) hypertension: Secondary | ICD-10-CM

## 2023-08-16 NOTE — Telephone Encounter (Signed)
 Requested Prescriptions  Pending Prescriptions Disp Refills   amLODipine (NORVASC) 10 MG tablet [Pharmacy Med Name: AMLODIPINE BESYLATE 10 MG TAB] 90 tablet 0    Sig: TAKE 1 TABLET BY MOUTH EVERY DAY     Cardiovascular: Calcium Channel Blockers 2 Failed - 08/16/2023  2:15 PM      Failed - Last BP in normal range    BP Readings from Last 1 Encounters:  05/21/23 (!) 148/98         Failed - Valid encounter within last 6 months    Recent Outpatient Visits   None            Passed - Last Heart Rate in normal range    Pulse Readings from Last 1 Encounters:  05/21/23 77

## 2023-11-13 ENCOUNTER — Other Ambulatory Visit: Payer: Self-pay

## 2023-11-13 ENCOUNTER — Emergency Department
Admission: EM | Admit: 2023-11-13 | Discharge: 2023-11-14 | Disposition: A | Discharge status: Home / Self Care | Attending: Emergency Medicine | Admitting: Emergency Medicine

## 2023-11-13 ENCOUNTER — Ambulatory Visit
Admission: RE | Admit: 2023-11-13 | Discharge: 2023-11-13 | Disposition: A | Discharge status: Home / Self Care | Source: Home / Self Care | Attending: Student | Admitting: Student

## 2023-11-13 ENCOUNTER — Ambulatory Visit: Payer: Self-pay | Admitting: Student

## 2023-11-13 ENCOUNTER — Other Ambulatory Visit
Admission: RE | Admit: 2023-11-13 | Discharge: 2023-11-13 | Disposition: A | Discharge status: Home / Self Care | Source: Home / Self Care | Attending: Student | Admitting: Student

## 2023-11-13 ENCOUNTER — Encounter: Payer: Self-pay | Admitting: Student

## 2023-11-13 ENCOUNTER — Encounter: Payer: Self-pay | Admitting: Family Medicine

## 2023-11-13 ENCOUNTER — Ambulatory Visit: Admitting: Student

## 2023-11-13 ENCOUNTER — Ambulatory Visit
Admission: RE | Admit: 2023-11-13 | Discharge: 2023-11-13 | Disposition: A | Discharge status: Home / Self Care | Source: Ambulatory Visit | Attending: Student | Admitting: Student

## 2023-11-13 VITALS — BP 164/100 | HR 98 | Temp 98.3°F | Ht 70.0 in | Wt 279.1 lb

## 2023-11-13 DIAGNOSIS — R791 Abnormal coagulation profile: Secondary | ICD-10-CM | POA: Insufficient documentation

## 2023-11-13 DIAGNOSIS — I10 Essential (primary) hypertension: Secondary | ICD-10-CM

## 2023-11-13 DIAGNOSIS — R0609 Other forms of dyspnea: Secondary | ICD-10-CM | POA: Insufficient documentation

## 2023-11-13 DIAGNOSIS — R0602 Shortness of breath: Secondary | ICD-10-CM | POA: Diagnosis present

## 2023-11-13 DIAGNOSIS — R7989 Other specified abnormal findings of blood chemistry: Secondary | ICD-10-CM

## 2023-11-13 LAB — D-DIMER, QUANTITATIVE: D-Dimer, Quant: 0.74 ug{FEU}/mL — ABNORMAL HIGH (ref 0.00–0.50)

## 2023-11-13 NOTE — ED Triage Notes (Signed)
 Says he woke up this morning with a rattling of the chest followed with a very brief period of shortness of breath.   Went to PCP earlier and all sx resolved.   Tonight received a call from office saying d-dimer was elevated.   Encouraged to ED for eval. Currently denies any cp, shob or discomfort.

## 2023-11-13 NOTE — Assessment & Plan Note (Signed)
 BP very elevated today. Reports he is taking his medications. Including amlodipine  10 mg daily, lisinopril -hydrochlorothiazide  20-25 mg daily. Is having a headache today. Will have him follow up with PCP in 1 week, may need to increase his medications.

## 2023-11-13 NOTE — Assessment & Plan Note (Addendum)
 Single episode of dyspnea this morning the resolved after resting while using his CPAP machine. On exam today he is hypertensive with initial tachycardia that improved with res. He is saturating, speaking in full sentences, in no acute respiratory distress. Cardiac exam is unremarkable, no JVD, or signs of fluid overload. Lung are clear to ascultation. CXR is unremarkable. ECG with normal rate, no acute ST or T wave changes, LVH likely due to uncontrolled htn. Low suspicion of VTE will check ddimer. Dyspnea may be related to CPAP non adherence, encouraged him to use CPAP and take BP medication regularly.

## 2023-11-13 NOTE — ED Notes (Signed)
Forbach MD at bedside. 

## 2023-11-13 NOTE — Progress Notes (Signed)
 Established Patient Office Visit  Subjective   Patient ID: Ian Romero, male    DOB: 1986-02-12  Age: 38 y.o. MRN: [id number]  Chief Complaint  Patient presents with   Shortness of Breath    Patient states he had a hard time catching his breath this morning, he hears like a ratting noise on his chest   Headache    Started this morning, 8/10 pain scale, located on the forehead and side of the head   Reports dyspnea and rattling sensation in his chest after getting out of bed this morning around 7 am this morning. Dyspnea worse with laying down and exertion. Reports some clear sputum, but not significant cough. He had some warm tea and rested with his CPAP machine and started feeling better around noon. Felt very winded after walking up the stairs this morning. Report migraine headache for which he took Excedrin with partial improvement.  Has not been using his CPAP machine in the last few weeks. He deniea fever, chills, chest pain, cough, sore throat lighheadedness, dizziness, n/v/d/, or constipation. No recent sick contact, travel, or immobility.   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    Appearance: Normal appearance.  HENT:     Mouth/Throat:     Mouth: Mucous membranes are moist.     Pharynx: Oropharynx is clear.  Cardiovascular:     Rate and Rhythm: Normal rate and regular rhythm.     Heart sounds: No murmur heard.    No friction rub.     Comments: No JVD. 1+ pretibial edema bilaterally  Pulmonary:     Effort: Pulmonary effort is normal. No tachypnea or respiratory distress.     Comments: Breath sounds quiet due to habitus, no wheezing, or crackles. Abdominal:     General: Abdomen is flat. Bowel sounds are normal. There is no distension.     Palpations: Abdomen is soft.     Tenderness: There is no abdominal tenderness.  Skin:    General: Skin is warm and dry.     Capillary Refill: Capillary refill takes less than 2 seconds.  Neurological:     General: No focal deficit present.     Mental Status: He is alert and oriented to person, place, and time.  Psychiatric:        Mood and Affect: Mood normal.        Behavior: Behavior normal.        05/21/2023  1:33 PM 05/22/2022    3:39 PM 02/19/2022   11:08 AM  Depression screen PHQ 2/9  Decreased Interest 0 0 0  Down, Depressed, Hopeless 0 0 0  PHQ - 2 Score 0 0 0  Altered sleeping 1 0 0  Tired, decreased energy 1 0 0  Change in appetite 0 0 0  Feeling bad or failure about yourself  0 0 0  Trouble concentrating 0 0 0  Moving slowly or fidgety/restless 0 0 0  Suicidal thoughts 0 0 0  PHQ-9 Score 2 0 0  Difficult doing work/chores Not difficult at all Not difficult at all Not difficult at all       05/21/2023    1:33 PM 05/22/2022    3:39 PM 02/19/2022   11:08 AM 12/20/2021   11:08 AM  GAD 7 : Generalized Anxiety Score  Nervous, Anxious, on Edge 0 0 0 0  Control/stop worrying 0 0 0 0  Worry too much - different things 0 0 0 0  Trouble relaxing 0 0 0 0  Restless 0 0 0 1  Easily  annoyed or irritable 0 0 0 0  Afraid - awful might happen 0 0 0 0  Total GAD 7 Score 0 0 0 1  Anxiety Difficulty Not difficult at all Not difficult at all Not difficult at all Not difficult at all    No results found for any visits on 11/13/23.  Last CBC Lab Results  Component Value Date   WBC 4.0 02/22/2021   HGB 14.6 02/22/2021   HCT 44.3 02/22/2021   MCV 82 02/22/2021   MCH 27.0 02/22/2021   RDW 13.8 02/22/2021   PLT 246 02/22/2021   Last metabolic panel Lab Results  Component Value Date   GLUCOSE 93 02/22/2021   NA 140 02/22/2021   K 4.5 02/22/2021   CL 102 02/22/2021   CO2 24 02/22/2021   BUN 11 02/22/2021   CREATININE 0.96 02/22/2021   EGFR 105 02/22/2021   CALCIUM 9.9 02/22/2021   PROT 7.2 02/22/2021   ALBUMIN 4.9 02/22/2021   LABGLOB 2.3 02/22/2021   AGRATIO 2.1 02/22/2021   BILITOT 0.4 02/22/2021   ALKPHOS 89 02/22/2021   AST 24 02/22/2021   ALT 29 02/22/2021      The ASCVD Risk score (Arnett DK, et al., 2019) failed to calculate for the following reasons:   The 2019 ASCVD risk score is only valid for ages 75 to 75    Assessment & Plan:  Dyspnea on exertion Assessment & Plan: Single episode of dyspnea this morning the resolved after resting while using his CPAP machine. On exam today he is hypertensive with initial tachycardia that improved with res. He is saturating, speaking in full sentences, in no acute respiratory distress. Cardiac exam is unremarkable, no JVD, or signs of fluid overload. Lung are clear to ascultation. CXR is unremarkable. ECG with normal rate, no acute ST or T wave changes, LVH likely due to uncontrolled htn. Low suspicion of VTE will check ddimer. Dyspnea may be related to CPAP non adherence, encouraged him to use CPAP and take BP medication regularly.   Orders: -     D-dimer, quantitative -     DG Chest 2 View; Future -     EKG 12-Lead  Hypertension, unspecified type Assessment & Plan: BP very elevated today. Reports he is  taking his medications. Including amlodipine  10 mg daily, lisinopril -hydrochlorothiazide  20-25 mg daily. Is having a headache today. Will have him follow up  with PCP in 1 week, may need to increase his medications.       Return in about 2 weeks (around 11/27/2023) for HTN.    Harlene Saddler, MD

## 2023-11-14 ENCOUNTER — Emergency Department

## 2023-11-14 LAB — BASIC METABOLIC PANEL WITH GFR
Anion gap: 8 (ref 5–15)
BUN: 16 mg/dL (ref 6–20)
CO2: 28 mmol/L (ref 22–32)
Calcium: 9.1 mg/dL (ref 8.9–10.3)
Chloride: 102 mmol/L (ref 98–111)
Creatinine, Ser: 1.18 mg/dL (ref 0.61–1.24)
GFR, Estimated: >60 mL/min (ref >60)
Glucose, Bld: 110 mg/dL — ABNORMAL HIGH (ref 70–99)
Potassium: 3.6 mmol/L (ref 3.5–5.1)
Sodium: 138 mmol/L (ref 135–145)

## 2023-11-14 LAB — CBC WITH DIFFERENTIAL/PLATELET
Abs Immature Granulocytes: 0.03 10*3/uL (ref 0.00–0.07)
Basophils Absolute: 0 10*3/uL (ref 0.0–0.1)
Basophils Relative: 1 %
Eosinophils Absolute: 0.1 10*3/uL (ref 0.0–0.5)
Eosinophils Relative: 2 %
HCT: 44.5 % (ref 39.0–52.0)
Hemoglobin: 14.7 g/dL (ref 13.0–17.0)
Immature Granulocytes: 0 %
Lymphocytes Relative: 42 %
Lymphs Abs: 2.8 10*3/uL (ref 0.7–4.0)
MCH: 27.7 pg (ref 26.0–34.0)
MCHC: 33 g/dL (ref 30.0–36.0)
MCV: 84 fL (ref 80.0–100.0)
Monocytes Absolute: 0.5 10*3/uL (ref 0.1–1.0)
Monocytes Relative: 7 %
Neutro Abs: 3.2 10*3/uL (ref 1.7–7.7)
Neutrophils Relative %: 48 %
Platelets: 237 10*3/uL (ref 150–400)
RBC: 5.3 MIL/uL (ref 4.22–5.81)
RDW: 14.6 % (ref 11.5–15.5)
WBC: 6.7 10*3/uL (ref 4.0–10.5)
nRBC: 0 % (ref 0.0–0.2)

## 2023-11-14 MED ORDER — IOHEXOL 350 MG/ML SOLN
100.0000 mL | Freq: Once | INTRAVENOUS | Status: AC | PRN
  Administered 2023-11-14: 100 mL via INTRAVENOUS

## 2023-11-14 NOTE — ED Provider Notes (Signed)
 [facility] Provider Note    Event Date/Time   First MD Initiated Contact with Patient 11/13/23 2309     (approximate)   History   Abnormal Lab   HPI Ian Romero is a 38 y.o. male who presents at the recommendation of his outpatient doctor for an elevated D-dimer.  He reports that he woke up this morning feeling short of breath.  He improved throughout the day without any particular treatment.  He has sleep apnea and was not using his CPAP overnight.  He has no history of asthma, COPD, nor tobacco use.  He has no history of cardiac disease.  When he woke up feeling short of breath he scheduled a clinic PCP appointment today.  By the time he went to the appointment he felt better with no symptoms.  His physician ordered an EKG, chest x-ray, and a D-dimer.  The EKG and chest x-ray were reassuring but when his D-dimer came back elevated, he was contacted and told to go to the emergency department.  The patient reports he continues to have no shortness of breath.  He has had no chest pain throughout the day.  He has no history of unilateral leg pain or swelling, no history of blood clots in the legs of the lungs, and has had no recent surgeries nor immobilizations.     Physical Exam   Triage Vital Signs: ED Triage Vitals [11/13/23 2142]  Encounter Vitals Group     BP (!) 180/119     Girls Systolic BP Percentile      Girls Diastolic BP Percentile      Boys Systolic BP Percentile      Boys Diastolic BP Percentile      Pulse Rate 85     Resp 18     Temp 99.3 F (37.4 C)     Temp Source Oral     SpO2 97 %     Weight 126.6 kg (279 lb)     Height 1.778 m (5' 10)     Head Circumference      Peak Flow      Pain Score 0     Pain Loc      Pain Education      Exclude from Growth Chart     Most recent vital signs: Vitals:   11/14/23 0000 11/14/23 0030  BP: (!) 163/109 (!) 156/101  Pulse: 79 79  Resp:  18  Temp:    SpO2: 95% 98%    General: Awake,  no distress.  CV:  Good peripheral perfusion.  Regular rate and rhythm, normal heart sounds. Resp:  Normal effort. Speaking easily and comfortably, no accessory muscle usage nor intercostal retractions.  Lungs are clear to auscultation bilaterally with no wheezes, rales, nor rhonchi. Abd:  Obese.  No tenderness to palpation of the abdomen. Other:  No peripheral edema.   ED Results / Procedures / Treatments   Labs (all labs ordered are listed, but only abnormal results are displayed) Labs Reviewed  BASIC METABOLIC PANEL WITH GFR - Abnormal; Notable for the following components:      Result Value   Glucose, Bld 110 (*)    All other components within normal limits  CBC WITH DIFFERENTIAL/PLATELET     EKG  (This EKG was ordered at his primary care office today rather than in the emergency department.)  ED ECG REPORT I, Darleene Dome, the attending physician, personally viewed and interpreted this ECG.  Date: 11/13/2023 EKG Time:  3:01 PM Rate: 91 Rhythm: normal sinus rhythm QRS Axis: normal Intervals: normal ST/T Wave abnormalities: Non-specific ST segment / T-wave changes, but no clear evidence of acute ischemia.  The patient has some anterior changes most consistent with early repolarization/J-point elevation Narrative Interpretation: no definitive evidence of acute ischemia; does not meet STEMI criteria.    RADIOLOGY I independently viewed and interpreted the two-view chest x-ray ordered earlier today by his primary care provider.  I see no evidence of pneumonia, pulmonary edema, nor wide mediastinum.  I also read the radiologist's report, which confirmed no acute findings.   PROCEDURES:  Critical Care performed: No  Procedures    IMPRESSION / MDM / ASSESSMENT AND PLAN / ED COURSE  I reviewed the triage vital signs and the nursing notes.                              Differential diagnosis includes, but is not limited to, sleep apnea, viral illness, PE,  pneumonia.  Patient's presentation is most consistent with acute presentation with potential threat to life or bodily function.  Labs/studies ordered: BMP, CBC with differential, CTA chest  Interventions/Medications given:  Medications  iohexol (OMNIPAQUE) 350 MG/ML injection 100 mL (100 mLs Intravenous Contrast Given 11/14/23 0101)    (Note:  hospital course my include additional interventions and/or labs/studies not listed above.)   Patient is hypertensive, otherwise normal vitals.  BMP and CBC with differential are all within normal limits.  I reviewed the outpatient note written by Dr. Lemon who mentioned that his shortness of breath was likely due to his sleep apnea and noncompliance with CPAP and that she had a low suspicion for PE but that she would check a D-dimer.  I had my usual D-dimer discussion with the patient including his low risk.  He is technically PERC negative, but since he had a positive D-dimer, he would like to proceed with the CTA.  We will proceed and I anticipate outpatient follow-up as needed.  He is asymptomatic at this time     Clinical Course as of 11/14/23 0229  Thu Nov 14, 2023  0224 CT Angio Chest PE W/Cm &/Or Wo Cm I independently viewed and interpret the patient's CTA chest and I see no evidence of pneumonia nor PE.  The radiologist identified a pulmonary nodule which is likely benign particularly in this young patient with no history of smoking.  I updated the patient and he is comfortable with plan for discharge and outpatient follow-up.  I also told him about the pulmonary nodule finding and referred him to the pulmonary nodule clinic.  He can follow-up as an outpatient with his regular doctor.  The patient's medical screening exam is reassuring with no indication of an emergent medical condition requiring hospitalization or additional evaluation at this point.  The patient is safe and appropriate for discharge and outpatient follow up. [CF]     Clinical Course User Index [CF] Gordan Huxley, MD     FINAL CLINICAL IMPRESSION(S) / ED DIAGNOSES   Final diagnoses:  Elevated d-dimer  Uncontrolled hypertension     Rx / DC Orders   ED Discharge Orders          Ordered    AMB  Referral to Pulmonary Nodule Clinic        11/14/23 0226             Note:  This document was prepared using Dragon voice recognition software  and may include unintentional dictation errors.   Gordan Huxley, MD 11/14/23 [phone]

## 2023-11-14 NOTE — Discharge Instructions (Signed)
 Your evaluation was reassuring with no evidence of blood clot in the lungs and no evidence of pneumonia.  We recommend you use your CPAP and follow-up with your regular doctor.  Of note, as we discussed, you have a pulmonary nodule that was seen on the CT scan.  This is likely benign.  However, we referred you to the pulmonary nodule clinic to help you follow-up and monitor the nodule to make sure it is not getting worse over an extended period of time.  Your blood pressure was also quite elevated tonight.  This is likely situational and since you are taking blood pressure medicine, we recommend you continue taking it but follow-up with your regular doctor at the next available opportunity to discuss whether you need an additional medication or change to your medication.

## 2023-11-20 ENCOUNTER — Encounter: Payer: Self-pay | Admitting: Family Medicine

## 2023-11-20 ENCOUNTER — Ambulatory Visit: Admitting: Family Medicine

## 2023-11-20 VITALS — BP 138/80 | HR 73 | Ht 70.0 in | Wt 274.2 lb

## 2023-11-20 DIAGNOSIS — I10 Essential (primary) hypertension: Secondary | ICD-10-CM | POA: Diagnosis not present

## 2023-11-20 DIAGNOSIS — G4733 Obstructive sleep apnea (adult) (pediatric): Secondary | ICD-10-CM | POA: Diagnosis not present

## 2023-11-20 MED ORDER — TIRZEPATIDE-WEIGHT MANAGEMENT 2.5 MG/0.5ML ~~LOC~~ SOLN: 2.5000 mg | SUBCUTANEOUS | 2 refills | Status: DC

## 2023-11-20 NOTE — Assessment & Plan Note (Signed)
 Hypertension - Elevated blood pressure for follow-up. - Recent blood pressure reading of 138/80 mmHg, improved from 167/93 mmHg on July 3rd. - Home blood pressure readings as low as 124/75 mmHg. - Not taking prescribed HTN meds since May. - Consuming organic beet juice since Sunday, he believes to contribute to improved blood pressure. - No recent episodes of elevated blood pressure without medication.  Physical Exam VITALS: BP- 138/80, SaO2- 99% CHEST: Lungs clear to auscultation bilaterally. CARDIOVASCULAR: Heart sounds clear. +S1, S2, RRR, no MRG  Hypertension Stage 1 hypertension with recent favorable home readings. Not taking antihypertensives, managing with lifestyle changes. - Discontinue blood pressure medications for now. - Encourage continuation of dietary and lifestyle changes. - Utilize CPAP consistently. - Schedule follow-up in three months to reassess blood pressure.

## 2023-11-20 NOTE — Patient Instructions (Signed)
 Patient Action Plan  1. Hypertension Management:    - Discontinue blood pressure medications for now, given improved readings.    - Continue dietary and lifestyle changes, including the use of organic beet juice.    - Schedule a follow-up appointment in three months to reassess blood pressure.  2. Obstructive Sleep Apnea (OSA) Management:    - Maintain consistent use of CPAP therapy.    - Address any congestion issues with appropriate treatment.    - Submit a prescription for Zepbound .    - Plan a video visit to discuss the initiation of Zepbound  once coverage is confirmed.  Red Flags: - If you experience any significant changes in blood pressure or sleep apnea symptoms, contact the clinic immediately.

## 2023-11-20 NOTE — Assessment & Plan Note (Signed)
 Obstructive sleep apnea and cpap use - History of sleep apnea. - Resumed regular CPAP use after three consecutive nights of disrupted sleep.  Obstructive Sleep Apnea Managed with CPAP therapy. Reports adherence with occasional congestion issues. - Encourage consistent CPAP use. - Address congestion with appropriate treatment. - Submit prescription for Zepbound . - Coordinate video visit once Zepbound  is covered to discuss initiation.

## 2023-11-20 NOTE — Progress Notes (Signed)
     Primary Care / Sports Medicine Office Visit  Patient Information:  Patient ID: Ian Romero, male DOB: 09-11-1985 Age: 38 y.o. MRN: [id number]   Ian Romero is a pleasant 38 y.o. male presenting with the following:  Chief Complaint  Patient presents with   Hypertension    Patient present today for a follow  up on his HTN. He is doing well and has ben taking his medications as directed.     Vitals:   11/20/23 1048 11/20/23 1054  BP: (!) 144/86 138/80  Pulse: 73   SpO2: 99%    Vitals:   11/20/23 1048  Weight: 274 lb 3.2 oz (124.4 kg)  Height: 5' 10 (1.778 m)   Body mass index is 39.34 kg/m.     Independent interpretation of notes and tests performed by another provider:   None  Procedures performed:   None  Pertinent History, Exam, Impression, and Recommendations:   Problem List Items Addressed This Visit     Hypertension - Primary   Hypertension - Elevated blood pressure for follow-up. - Recent blood pressure reading of 138/80 mmHg, improved from 167/93 mmHg on July 3rd. - Home blood pressure readings as low as 124/75 mmHg. - Not taking prescribed HTN meds since May. - Consuming organic beet juice since Sunday, he believes to contribute to improved blood pressure. - No recent episodes of elevated blood pressure without medication.  Physical Exam VITALS: BP- 138/80, SaO2- 99% CHEST: Lungs clear to auscultation bilaterally. CARDIOVASCULAR: Heart sounds clear. +S1, S2, RRR, no MRG  Hypertension Stage 1 hypertension with recent favorable home readings. Not taking antihypertensives, managing with lifestyle changes. - Discontinue blood pressure medications for now. - Encourage continuation of dietary and lifestyle changes. - Utilize CPAP consistently. - Schedule follow-up in three months to reassess blood pressure.      OSA on CPAP   Obstructive sleep apnea and cpap use - History of sleep apnea. - Resumed regular CPAP use after three consecutive nights  of disrupted sleep.  Obstructive Sleep Apnea Managed with CPAP therapy. Reports adherence with occasional congestion issues. - Encourage consistent CPAP use. - Address congestion with appropriate treatment. - Submit prescription for Zepbound . - Coordinate video visit once Zepbound  is covered to discuss initiation.      Relevant Medications   tirzepatide  (ZEPBOUND ) 2.5 MG/0.5ML injection vial     Orders & Medications Medications:  Meds ordered this encounter  Medications   tirzepatide  (ZEPBOUND ) 2.5 MG/0.5ML injection vial    Sig: Inject 2.5 mg into the skin once a week.    Dispense:  2 mL    Refill:  2   No orders of the defined types were placed in this encounter.    Return in about 3 months (around 02/20/2024) for BP check.     Selinda [name], MD, [facility]   Primary Care Sports Medicine Primary Care and Sports Medicine at [facility]

## 2023-11-21 ENCOUNTER — Telehealth: Payer: Self-pay

## 2023-11-21 NOTE — Telephone Encounter (Signed)
 Please complete PA for Zepbound 2.5 MG.  KP

## 2023-11-26 ENCOUNTER — Other Ambulatory Visit (HOSPITAL_BASED_OUTPATIENT_CLINIC_OR_DEPARTMENT_OTHER): Payer: Self-pay

## 2023-11-26 ENCOUNTER — Other Ambulatory Visit (HOSPITAL_COMMUNITY): Payer: Self-pay

## 2023-11-26 NOTE — Telephone Encounter (Signed)
 Cost exception for Ian Romero was denied, and the denial letter was uploaded to his media tab.

## 2023-11-26 NOTE — Telephone Encounter (Signed)
 FYI  KP

## 2023-11-26 NOTE — Telephone Encounter (Signed)
 Checking on a update for this PA.  KP

## 2023-11-26 NOTE — Telephone Encounter (Signed)
 Good morning, I did a test claim for Mr. Ian Romero  and it goes through successfully on his insurance however his copay comes back at $1035.19 and the plan did not pay anything.  I contacted [facility] [employer] and spoke with Alyssa and she stated it did go through but the plan did not pay anything, they just discounted the price. She is faxing me over a form to fill out to see if the plan will actually pay instead of just giving a discount.

## 2023-11-30 ENCOUNTER — Encounter: Payer: Self-pay | Admitting: Family Medicine

## 2023-12-02 ENCOUNTER — Other Ambulatory Visit: Payer: Self-pay | Admitting: Family Medicine

## 2023-12-02 MED ORDER — CELECOXIB 200 MG PO CAPS: 200.0000 mg | ORAL_CAPSULE | Freq: Two times a day (BID) | ORAL | 0 refills | Status: DC | PRN

## 2023-12-02 MED ORDER — METHOCARBAMOL 750 MG PO TABS: 750.0000 mg | ORAL_TABLET | Freq: Three times a day (TID) | ORAL | 0 refills | Status: DC | PRN

## 2023-12-02 NOTE — Telephone Encounter (Signed)
 DISREGARD

## 2023-12-02 NOTE — Telephone Encounter (Signed)
 Please review and advise.   JM

## 2023-12-05 ENCOUNTER — Telehealth: Payer: Self-pay | Admitting: Pulmonary Disease

## 2023-12-05 NOTE — Telephone Encounter (Signed)
 LVMTCB to schedule pulmonary consult.

## 2024-02-03 ENCOUNTER — Other Ambulatory Visit: Payer: Self-pay | Admitting: Family Medicine

## 2024-02-04 NOTE — Telephone Encounter (Signed)
 Requested medication (s) are due for refill today: na   Requested medication (s) are on the active medication list: yes   Last refill:  12/02/23 #60 0 refills   Future visit scheduled:  yes in 2 weeks  Notes to clinic:  no refills remain. Do you want to refill Rx?     Requested Prescriptions  Pending Prescriptions Disp Refills   celecoxib  (CELEBREX ) 200 MG capsule [Pharmacy Med Name: CELECOXIB  200 MG CAPSULE] 60 capsule 0    Sig: Take 1 capsule (200 mg total) by mouth 2 (two) times daily as needed.     Analgesics:  COX2 Inhibitors Failed - 02/04/2024  2:57 PM      Failed - Manual Review: Labs are only required if the patient has taken medication for more than 8 weeks.      Failed - AST in normal range and within 360 days    AST  Date Value Ref Range Status  02/22/2021 24 0 - 40 IU/L Final         Failed - ALT in normal range and within 360 days    ALT  Date Value Ref Range Status  02/22/2021 29 0 - 44 IU/L Final         Passed - HGB in normal range and within 360 days    Hemoglobin  Date Value Ref Range Status  11/13/2023 14.7 13.0 - 17.0 g/dL Final  89/87/7977 85.3 13.0 - 17.7 g/dL Final         Passed - Cr in normal range and within 360 days    Creatinine, Ser  Date Value Ref Range Status  11/13/2023 1.18 0.61 - 1.24 mg/dL Final         Passed - HCT in normal range and within 360 days    HCT  Date Value Ref Range Status  11/13/2023 44.5 39.0 - 52.0 % Final   Hematocrit  Date Value Ref Range Status  02/22/2021 44.3 37.5 - 51.0 % Final         Passed - eGFR is 30 or above and within 360 days    GFR, Estimated  Date Value Ref Range Status  11/13/2023 >60 >60 mL/min Final    Comment:    (NOTE) Calculated using the CKD-EPI Creatinine Equation (2021)    GFR  Date Value Ref Range Status  04/07/2019 117.89 >60.00 mL/min Final   eGFR  Date Value Ref Range Status  02/22/2021 105 >59 mL/min/1.73 Final         Passed - Patient is not pregnant       Passed - Valid encounter within last 12 months    Recent Outpatient Visits           2 months ago Hypertension, unspecified type   [facility] at [facility], Selinda PARAS, MD   2 months ago Dyspnea on exertion   [facility] Primary Care & Sports Medicine at [facility], MD       Future Appointments             In 2 weeks Ku, Selinda PARAS, MD [facility] Primary Care & Sports Medicine at [facility], [phone] [place]

## 2024-02-05 ENCOUNTER — Encounter: Payer: Self-pay | Admitting: Family Medicine

## 2024-02-05 NOTE — Telephone Encounter (Signed)
 Please review and advise patient.   JM

## 2024-02-20 ENCOUNTER — Ambulatory Visit: Admitting: Family Medicine

## 2024-02-26 ENCOUNTER — Other Ambulatory Visit: Payer: Self-pay | Admitting: Family Medicine

## 2024-02-28 NOTE — Telephone Encounter (Signed)
 Please review and approve if appropriate.   JM

## 2024-02-28 NOTE — Telephone Encounter (Signed)
 Requested medications are due for refill today.  yes  Requested medications are on the active medications list.  yes  Last refill. 12/02/2023 #60 0 rf  Future visit scheduled.   no  Notes to clinic.  Refill not delegated.    Requested Prescriptions  Pending Prescriptions Disp Refills   methocarbamol  (ROBAXIN ) 750 MG tablet [Pharmacy Med Name: METHOCARBAMOL  750 MG TABLET] 60 tablet 0    Sig: Take 1 tablet (750 mg total) by mouth every 8 (eight) hours as needed for muscle spasms.     Not Delegated - Analgesics:  Muscle Relaxants Failed - 02/28/2024 12:11 PM      Failed - This refill cannot be delegated      Passed - Valid encounter within last 6 months    Recent Outpatient Visits           3 months ago Hypertension, unspecified type   [facility] Primary Care & Sports Medicine at [facility], Selinda PARAS, MD   3 months ago Dyspnea on exertion   [facility] Primary Care & Sports Medicine at [facility], MD

## 2024-05-18 ENCOUNTER — Telehealth: Payer: Self-pay | Admitting: Family Medicine

## 2024-05-18 NOTE — Telephone Encounter (Signed)
 Left voice mail to have patient contact the office about his appointment 05/21/24.

## 2024-05-20 ENCOUNTER — Encounter: Admitting: Family Medicine

## 2024-05-20 ENCOUNTER — Telehealth: Payer: Self-pay

## 2024-05-20 NOTE — Telephone Encounter (Signed)
 LMOM for patient to call back in regards to appt on 05/21/24. Is he having a physical/ ortho/ or sick visit?   JM

## 2024-05-21 ENCOUNTER — Other Ambulatory Visit
Admission: RE | Admit: 2024-05-21 | Discharge: 2024-05-21 | Disposition: A | Discharge status: Home / Self Care | Attending: Family Medicine | Admitting: Family Medicine

## 2024-05-21 ENCOUNTER — Encounter: Payer: Self-pay | Admitting: Family Medicine

## 2024-05-21 ENCOUNTER — Ambulatory Visit: Admission: RE | Admit: 2024-05-21 | Source: Home / Self Care

## 2024-05-21 ENCOUNTER — Ambulatory Visit (INDEPENDENT_AMBULATORY_CARE_PROVIDER_SITE_OTHER): Admitting: Family Medicine

## 2024-05-21 VITALS — BP 128/88 | HR 92 | Ht 70.0 in | Wt 267.0 lb

## 2024-05-21 DIAGNOSIS — Z0000 Encounter for general adult medical examination without abnormal findings: Secondary | ICD-10-CM | POA: Insufficient documentation

## 2024-05-21 DIAGNOSIS — M47817 Spondylosis without myelopathy or radiculopathy, lumbosacral region: Secondary | ICD-10-CM | POA: Diagnosis not present

## 2024-05-21 DIAGNOSIS — M47815 Spondylosis without myelopathy or radiculopathy, thoracolumbar region: Secondary | ICD-10-CM

## 2024-05-21 DIAGNOSIS — I158 Other secondary hypertension: Secondary | ICD-10-CM

## 2024-05-21 DIAGNOSIS — F411 Generalized anxiety disorder: Secondary | ICD-10-CM

## 2024-05-21 DIAGNOSIS — M4722 Other spondylosis with radiculopathy, cervical region: Secondary | ICD-10-CM | POA: Diagnosis not present

## 2024-05-21 DIAGNOSIS — G4733 Obstructive sleep apnea (adult) (pediatric): Secondary | ICD-10-CM | POA: Diagnosis not present

## 2024-05-21 DIAGNOSIS — J0140 Acute pansinusitis, unspecified: Secondary | ICD-10-CM | POA: Diagnosis not present

## 2024-05-21 LAB — CBC
HCT: 44.5 % (ref 39.0–52.0)
Hemoglobin: 14.5 g/dL (ref 13.0–17.0)
MCH: 26.9 pg (ref 26.0–34.0)
MCHC: 32.6 g/dL (ref 30.0–36.0)
MCV: 82.4 fL (ref 80.0–100.0)
Platelets: 265 K/uL (ref 150–400)
RBC: 5.4 MIL/uL (ref 4.22–5.81)
RDW: 14.4 % (ref 11.5–15.5)
WBC: 5.1 K/uL (ref 4.0–10.5)
nRBC: 0 % (ref 0.0–0.2)

## 2024-05-21 LAB — COMPREHENSIVE METABOLIC PANEL WITH GFR
ALT: 40 U/L (ref 0–44)
AST: 42 U/L — ABNORMAL HIGH (ref 15–41)
Albumin: 4.6 g/dL (ref 3.5–5.0)
Alkaline Phosphatase: 102 U/L (ref 38–126)
Anion gap: 9 (ref 5–15)
BUN: 12 mg/dL (ref 6–20)
CO2: 27 mmol/L (ref 22–32)
Calcium: 9.2 mg/dL (ref 8.9–10.3)
Chloride: 101 mmol/L (ref 98–111)
Creatinine, Ser: 1.03 mg/dL (ref 0.61–1.24)
GFR, Estimated: >60 mL/min
Glucose, Bld: 112 mg/dL — ABNORMAL HIGH (ref 70–99)
Potassium: 3.9 mmol/L (ref 3.5–5.1)
Sodium: 137 mmol/L (ref 135–145)
Total Bilirubin: 0.4 mg/dL (ref 0.0–1.2)
Total Protein: 7.7 g/dL (ref 6.5–8.1)

## 2024-05-21 LAB — LIPID PANEL
Cholesterol: 176 mg/dL (ref 0–200)
HDL: 31 mg/dL — ABNORMAL LOW
LDL Cholesterol: 113 mg/dL — ABNORMAL HIGH (ref 0–99)
Total CHOL/HDL Ratio: 5.7 ratio
Triglycerides: 160 mg/dL — ABNORMAL HIGH
VLDL: 32 mg/dL (ref 0–40)

## 2024-05-21 LAB — HEMOGLOBIN A1C
Hgb A1c MFr Bld: 6.2 % — ABNORMAL HIGH (ref 4.8–5.6)
Mean Plasma Glucose: 131.24 mg/dL

## 2024-05-21 MED ORDER — PROMETHAZINE-DM 6.25-15 MG/5ML PO SYRP: 5.0000 mL | ORAL_SOLUTION | Freq: Four times a day (QID) | ORAL | 0 refills | Status: AC | PRN

## 2024-05-21 MED ORDER — AZITHROMYCIN 250 MG PO TABS: ORAL_TABLET | ORAL | 0 refills | Status: AC

## 2024-05-21 MED ORDER — TIZANIDINE HCL 4 MG PO TABS: 4.0000 mg | ORAL_TABLET | Freq: Three times a day (TID) | ORAL | 0 refills | Status: AC | PRN

## 2024-05-21 MED ORDER — PREDNISONE 50 MG PO TABS: 50.0000 mg | ORAL_TABLET | Freq: Every day | ORAL | 0 refills | Status: AC

## 2024-05-21 MED ORDER — DICLOFENAC SODIUM 75 MG PO TBEC: 75.0000 mg | DELAYED_RELEASE_TABLET | Freq: Two times a day (BID) | ORAL | 0 refills | Status: AC | PRN

## 2024-05-21 NOTE — Patient Instructions (Addendum)
-   Obtain fasting labs with orders provided (can have water or black coffee but otherwise no food or drink x 8 hours before labs) - Review information provided - Attend eye doctor annually, dentist every 6 months, work towards or maintain 30 minutes of moderate intensity physical activity at least 5 days per week, and consume a balanced diet - Return in 1 year for physical - Contact us  for any questions between now and then   VISIT SUMMARY:  During your visit, we addressed your persistent sinus symptoms, acute low back pain from a recent fall, and ongoing issues with dry mouth. We also discussed your obstructive sleep apnea and its management.  YOUR PLAN:  ACUTE SINUSITIS: You have persistent nasal congestion, runny nose, and sinus tenderness consistent with acute sinusitis. -You are prescribed azithromycin  (Z-Pak) and prednisone  for 5 days. -Continue using Mucinex, Xyzal , and Sensimist. -Use cough syrup as needed. -Increase your fluid intake. -We have ordered blood work to check for other contributing factors. -Follow up if your symptoms do not improve or worsen. We may refer you to an allergy/immunology specialist if needed.  ACUTE LOW BACK STRAIN: You have acute low back pain, left hip pain, and mid-back tightness following a fall, consistent with muscle strain and spasm. -You are prescribed prednisone  for 5 days. -You are prescribed tizanidine  up to three times daily as needed. Avoid driving or operating machinery if you feel drowsy. -You are prescribed diclofenac  to be started after completing prednisone , as needed for pain control. -Monitor for new or worsening symptoms such as persistent incontinence, weakness, numbness, or saddle anesthesia. -Start the prescribed back exercises after the acute pain improves, likely next week. -Follow up if your pain worsens or does not improve, as imaging may be needed. -A work note has been provided for your absence through  05/27/2024.  OBSTRUCTIVE SLEEP APNEA: Your obstructive sleep apnea is managed with CPAP, which you had difficulty using during your sinusitis episode. -Continue using your CPAP therapy as tolerated. -We discussed weight loss medications (Zepbound ) for managing your sleep apnea, but insurance coverage was denied. We will revisit this if future lab results provide new indications. -We have ordered blood work to assess for underlying metabolic contributors and to support future treatment options if indicated.  XEROSTOMIA: You have persistent dry mouth, which may be due to multiple factors including your medication Xyzal . -Maintain adequate hydration. -Continue taking Xyzal  due to your ongoing sinus and allergy symptoms.

## 2024-05-21 NOTE — Progress Notes (Signed)
 "    Annual Physical Exam Visit  Patient Information:  Patient ID: Ian Romero, male DOB: 29-Apr-1986 Age: 39 y.o. MRN: [id number]   Subjective:   CC: Annual Physical Exam  HPI:  Ian Romero is here for their annual physical.  I reviewed the past medical history, family history, social history, surgical history, and allergies today and changes were made as necessary.  Please see the problem list section below for additional details.  Past Medical History: Past Medical History:  Diagnosis Date   Allergy    Anxiety    Chronic headaches    Depression    GERD (gastroesophageal reflux disease)    Hyperlipidemia    Hypertension    Past Surgical History: Past Surgical History:  Procedure Laterality Date   APPENDECTOMY     NECK SURGERY  2008/2009   C3-4 per patient   Family History: Family History  Problem Relation Age of Onset   Allergies Mother    Allergies Daughter    Allergies Son    Allergies Son    Hypertension Maternal Grandmother    Diabetes Maternal Grandmother    Diabetes Paternal Grandmother    Asthma Paternal Grandfather    Stroke Paternal Grandfather    Allergies: Allergies[1] Health Maintenance: Health Maintenance  Topic Date Due   Hepatitis B Vaccines 19-59 Average Risk (1 of 3 - 19+ 3-dose series) Never done   HPV VACCINES (1 - 3-dose SCDM series) Never done   Influenza Vaccine  Never done   DTaP/Tdap/Td (2 - Td or Tdap) 08/23/2025   Hepatitis C Screening  Completed   HIV Screening  Completed   Pneumococcal Vaccine  Aged Out   Meningococcal B Vaccine  Aged Out   COVID-19 Vaccine  Discontinued    HM Colonoscopy   This patient has no relevant Health Maintenance data.    Medications: Medications Ordered Prior to Encounter[2]  Discussed the use of AI scribe software for clinical note transcription with the patient, who gave verbal consent to proceed.   Objective:   Vitals:   05/21/24 0918  BP: 128/88  Pulse: 92  SpO2: 98%   Vitals:    05/21/24 0918  Weight: 267 lb (121.1 kg)  Height: 5' 10 (1.778 m)   Body mass index is 38.31 kg/m.  General: Well Developed, well nourished, and in no acute distress.  Neuro: Alert and oriented x3, extra-ocular muscles intact, sensation grossly intact. Cranial nerves II through XII are grossly intact, motor, sensory, and coordinative functions are intact. HEENT: Normocephalic, atraumatic, neck supple, no masses, no lymphadenopathy, thyroid  nonenlarged. Oropharynx, nasopharynx, external ear canals are unremarkable. Skin: Warm and dry, no rashes noted.  Cardiac: Regular rate and rhythm, no murmurs rubs or gallops. No peripheral edema. Pulses symmetric. Respiratory: Clear to auscultation bilaterally. Speaking in full sentences.  Abdominal: Soft, nontender, nondistended, positive bowel sounds, no masses, no organomegaly. Musculoskeletal: Stable, and with full range of motion.  Impression and Recommendations:   History of Present Illness Ian Romero is a 39 year old male with obstructive sleep apnea who presents with persistent sinus symptoms and acute low back pain following a recent fall.  Upper respiratory symptoms - Sinus symptoms began prior to the week of May 04, 2024, including nasal congestion and nocturnal dyspnea. - Nocturnal dyspnea interfered with CPAP use for approximately one week. - Initial use of mist and pseudoephedrine provided partial relief, allowing resumption of CPAP during the week of Christmas. - Symptoms improved transiently but recurred after returning home, with ongoing  rhinorrhea and frequent need to blow his nose. - Current medications include guaifenesin, levocetirizine, and fluticasone  furoate nasal spray. - Dextromethorphan-containing cough syrup improved cough but caused drowsiness. - Unable to work this week due to these symptoms.  Xerostomia and dysphagia - Persistent xerostomia, sometimes severe. - Occasional dysphagia attributed to lack  of saliva.  Acute musculoskeletal pain following fall - On December 30 or 31, 2025, slipped down stairs, landing on left hip, back, and occiput. - No loss of consciousness. - Developed mid-thoracic back soreness and tightness, left hip pain consistent with contusion, and cervical muscle tightness. - No loss of strength, abnormal leg movement, bowel or bladder incontinence, or extremity numbness or paresthesia. - No use of acetaminophen , NSAIDs, or topical analgesics for back pain, as these were previously ineffective.  Urinary symptoms - Since the fall, mild stress urinary incontinence with forceful coughing, described as minimal urine leakage. - Preserved perineal sensation.  Assessment and Plan Acute sinusitis Persistent nasal congestion, rhinorrhea, and frontal sinus tenderness consistent with acute sinusitis. Symptoms unresponsive to OTC therapies, impacting CPAP use. Antibiotic therapy indicated due to duration and severity. - Prescribed azithromycin  (Z-Pak). - Prescribed prednisone  for 5 days. - Prescribed cough syrup as needed. - Advised continuation of Mucinex, Xyzal , and Sensimist. - Recommended increased hydration. - Ordered blood work to evaluate for other contributing factors. - Advised follow-up if symptoms do not improve or worsen; referral to allergy/immunology may be considered if persistent.  Acute on chronic low back pain Acute low back pain, left hip pain, and mid-back tightness following a fall, consistent with acute low back strain and muscle spasm, in setting of chronic thoracolumbar and lumbosacral pain. No radiculopathy or nerve impingement. Single episode of stress urinary incontinence likely due to increased intra-abdominal pressure. No ongoing neurological deficits. Imaging not indicated unless symptoms worsen or persist. - Prescribed prednisone  for 5 days. - Prescribed tizanidine  up to three times daily as needed, with counseling regarding drowsiness and  avoidance of driving or operating machinery if sedated. - Prescribed diclofenac  to be started after completion of prednisone , as needed for pain control. - Provided reassurance regarding absence of neurological deficits. - Advised monitoring for new or worsening symptoms (persistent incontinence, weakness, numbness, or saddle anesthesia). - Advised initiation of prescribed back exercises after acute pain improves, likely next week. - Advised follow-up if pain worsens or does not improve, as imaging may be indicated. - Provided work note for absence through 05/27/2024.  Obstructive sleep apnea Obstructive sleep apnea managed with CPAP. Difficulty using CPAP during acute sinusitis episode, now resolved. Blood pressure well controlled. Weight loss medication (Zepbound ) discussed but insurance coverage denied. Will revisit if future lab results provide new indications. - Advised continuation of CPAP therapy as tolerated. - Discussed potential for weight loss medications (Zepbound ) for OSA management; will revisit if future lab results provide new indications. - Ordered blood work to assess for underlying metabolic contributors and to support future treatment options if indicated.  The patient was counselled, risk factors were discussed, and anticipatory guidance given.  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    Dispense:  5 tablet    Refill:  0   azithromycin  (ZITHROMAX ) 250 MG tablet    Sig: Take 2 tablets on day 1, then 1 tablet daily on days 2 through 5    Dispense:  6 tablet    Refill:  0   promethazine -dextromethorphan (PROMETHAZINE -DM) 6.25-15 MG/5ML syrup    Sig: Take 5 mLs by mouth 4 (four) times daily as needed for cough.    Dispense:  118 mL    Refill:  0   Orders Placed This Encounter  Procedures   CBC   Comprehensive metabolic panel with GFR   Hemoglobin A1c   Lipid panel     No follow-ups on file.    Selinda [name], MD, CAQSM   Primary Care Sports Medicine Primary Care and Sports Medicine at [facility]      [1]  Allergies Allergen Reactions   Nasonex  [Mometasone  Furoate] Other (See Comments)    Nasal irritation  [2]  Current Outpatient Medications on File Prior to Visit  Medication Sig Dispense Refill   celecoxib  (CELEBREX ) 200 MG capsule TAKE 1 CAPSULE (200 MG TOTAL) BY MOUTH 2 (TWO) TIMES DAILY AS NEEDED. 60 capsule 0   levocetirizine (XYZAL ) 5 MG tablet Take 5 mg by mouth every evening.     methocarbamol  (ROBAXIN ) 750 MG tablet Take 1 tablet (750 mg total) by mouth every 8 (eight) hours as needed for muscle spasms. 60 tablet 0   No current facility-administered medications on file prior to visit.   "

## 2024-05-22 ENCOUNTER — Ambulatory Visit: Payer: Self-pay | Admitting: Family Medicine

## 2024-05-22 ENCOUNTER — Encounter: Payer: Self-pay | Admitting: Family Medicine

## 2024-06-04 ENCOUNTER — Encounter: Admitting: Family Medicine

## 2024-06-15 ENCOUNTER — Other Ambulatory Visit: Payer: Self-pay | Admitting: Family Medicine

## 2024-06-15 DIAGNOSIS — M47815 Spondylosis without myelopathy or radiculopathy, thoracolumbar region: Secondary | ICD-10-CM

## 2024-06-15 DIAGNOSIS — M4722 Other spondylosis with radiculopathy, cervical region: Secondary | ICD-10-CM

## 2024-06-15 DIAGNOSIS — M47817 Spondylosis without myelopathy or radiculopathy, lumbosacral region: Secondary | ICD-10-CM

## 2024-06-17 NOTE — Telephone Encounter (Signed)
 Requested medication (s) are due for refill today: review  Requested medication (s) are on the active medication list: yes  Last refill:  05/21/24 #30/0  Future visit scheduled: no  Notes to clinic:  Unable to refill per protocol, cannot delegate. Celebrex  The original prescription was discontinued on 05/21/2024 by Alvia Selinda PARAS, MD.       Requested Prescriptions  Pending Prescriptions Disp Refills   tiZANidine  (ZANAFLEX ) 4 MG tablet [Pharmacy Med Name: TIZANIDINE  HCL 4 MG TABLET] 30 tablet 0    Sig: Take 1 tablet (4 mg total) by mouth every 8 (eight) hours as needed for muscle spasms.     Not Delegated - Cardiovascular:  Alpha-2 Agonists - tizanidine  Failed - 06/17/2024 11:24 AM      Failed - This refill cannot be delegated      Passed - Valid encounter within last 6 months    Recent Outpatient Visits           3 weeks ago Healthcare maintenance   [facility] Primary Care & Sports Medicine at [facility], Selinda PARAS, MD   7 months ago Hypertension, unspecified type   [facility] Primary Care & Sports Medicine at [facility], Selinda PARAS, MD   7 months ago Dyspnea on exertion   [facility] Primary Care & Sports Medicine at [facility], Harlene, MD               celecoxib  (CELEBREX ) 200 MG capsule [Pharmacy Med Name: CELECOXIB  200 MG CAPSULE] 60 capsule 0    Sig: TAKE 1 CAPSULE (200 MG TOTAL) BY MOUTH 2 (TWO) TIMES DAILY AS NEEDED.     Analgesics:  COX2 Inhibitors Failed - 06/17/2024 11:24 AM      Failed - Manual Review: Labs are only required if the patient has taken medication for more than 8 weeks.      Failed - AST in normal range and within 360 days    AST  Date Value Ref Range Status  05/21/2024 42 (H) 15 - 41 U/L Final         Passed - HGB in normal range and within 360 days    Hemoglobin  Date Value Ref Range Status  05/21/2024 14.5 13.0 - 17.0 g/dL Final  89/87/7977 85.3 13.0 - 17.7 g/dL Final         Passed - Cr in  normal range and within 360 days    Creatinine, Ser  Date Value Ref Range Status  05/21/2024 1.03 0.61 - 1.24 mg/dL Final         Passed - HCT in normal range and within 360 days    HCT  Date Value Ref Range Status  05/21/2024 44.5 39.0 - 52.0 % Final   Hematocrit  Date Value Ref Range Status  02/22/2021 44.3 37.5 - 51.0 % Final         Passed - ALT in normal range and within 360 days    ALT  Date Value Ref Range Status  05/21/2024 40 0 - 44 U/L Final         Passed - eGFR is 30 or above and within 360 days    GFR, Estimated  Date Value Ref Range Status  05/21/2024 >60 >60 mL/min Final    Comment:    (NOTE) Calculated using the CKD-EPI Creatinine Equation (2021)    GFR  Date Value Ref Range Status  04/07/2019 117.89 >60.00 mL/min Final   eGFR  Date Value Ref Range Status  02/22/2021 105 >59 mL/min/1.73 Final         Passed - Patient is not pregnant      Passed - Valid encounter within last 12 months    Recent Outpatient Visits           3 weeks ago Healthcare maintenance   [facility] Primary Care & Sports Medicine at [facility], Selinda PARAS, MD   7 months ago Hypertension, unspecified type   [facility] Primary Care & Sports Medicine at [facility], Selinda PARAS, MD   7 months ago Dyspnea on exertion   [facility] Health Primary Care & Sports Medicine at [facility], MD

## 2024-06-17 NOTE — Telephone Encounter (Signed)
 Please review.Thank you. LB

## 2025-05-27 ENCOUNTER — Encounter: Admitting: Family Medicine
